# Patient Record
Sex: Female | Born: 1965 | Race: White | Hispanic: No | Marital: Married | Smoking: Never smoker
Health system: Southern US, Community
[De-identification: ages and names within clinical notes are randomized; demographics above are authoritative.]

## PROBLEM LIST (undated history)

## (undated) DIAGNOSIS — Z8489 Family history of other specified conditions: Secondary | ICD-10-CM

## (undated) DIAGNOSIS — G4730 Sleep apnea, unspecified: Secondary | ICD-10-CM

## (undated) DIAGNOSIS — R7303 Prediabetes: Secondary | ICD-10-CM

## (undated) DIAGNOSIS — R519 Headache, unspecified: Secondary | ICD-10-CM

## (undated) DIAGNOSIS — F988 Other specified behavioral and emotional disorders with onset usually occurring in childhood and adolescence: Secondary | ICD-10-CM

## (undated) DIAGNOSIS — T8859XA Other complications of anesthesia, initial encounter: Secondary | ICD-10-CM

## (undated) DIAGNOSIS — F32A Depression, unspecified: Secondary | ICD-10-CM

## (undated) DIAGNOSIS — D649 Anemia, unspecified: Secondary | ICD-10-CM

## (undated) DIAGNOSIS — T4145XA Adverse effect of unspecified anesthetic, initial encounter: Secondary | ICD-10-CM

## (undated) DIAGNOSIS — F419 Anxiety disorder, unspecified: Secondary | ICD-10-CM

## (undated) DIAGNOSIS — R51 Headache: Secondary | ICD-10-CM

## (undated) HISTORY — PX: CHOLECYSTECTOMY: SHX55

## (undated) HISTORY — PX: OTHER SURGICAL HISTORY: SHX169

## (undated) HISTORY — PX: ABDOMINAL HYSTERECTOMY: SHX81

---

## 2011-06-02 ENCOUNTER — Ambulatory Visit: Payer: Self-pay | Admitting: Internal Medicine

## 2012-07-14 ENCOUNTER — Ambulatory Visit: Payer: Self-pay | Admitting: Internal Medicine

## 2012-12-09 ENCOUNTER — Institutional Professional Consult (permissible substitution): Payer: Self-pay | Admitting: Cardiology

## 2014-06-19 ENCOUNTER — Ambulatory Visit
Admit: 2014-06-19 | Disposition: A | Discharge status: Home / Self Care | Payer: Self-pay | Attending: Primary Care | Admitting: Primary Care

## 2017-06-15 ENCOUNTER — Other Ambulatory Visit: Payer: Self-pay | Admitting: Internal Medicine

## 2017-06-15 DIAGNOSIS — Z1231 Encounter for screening mammogram for malignant neoplasm of breast: Secondary | ICD-10-CM

## 2017-07-16 ENCOUNTER — Ambulatory Visit
Admission: RE | Admit: 2017-07-16 | Discharge: 2017-07-16 | Disposition: A | Discharge status: Home / Self Care | Payer: BLUE CROSS/BLUE SHIELD | Source: Ambulatory Visit | Attending: Internal Medicine | Admitting: Internal Medicine

## 2017-07-16 DIAGNOSIS — Z1231 Encounter for screening mammogram for malignant neoplasm of breast: Secondary | ICD-10-CM | POA: Diagnosis not present

## 2017-08-06 ENCOUNTER — Other Ambulatory Visit: Payer: Self-pay

## 2017-08-06 ENCOUNTER — Encounter: Payer: Self-pay | Admitting: Gastroenterology

## 2017-08-06 ENCOUNTER — Ambulatory Visit (INDEPENDENT_AMBULATORY_CARE_PROVIDER_SITE_OTHER): Payer: BLUE CROSS/BLUE SHIELD | Admitting: Gastroenterology

## 2017-08-06 VITALS — BP 117/80 | HR 92 | Ht 66.0 in | Wt 242.0 lb

## 2017-08-06 DIAGNOSIS — K625 Hemorrhage of anus and rectum: Secondary | ICD-10-CM

## 2017-08-06 DIAGNOSIS — K5909 Other constipation: Secondary | ICD-10-CM

## 2017-08-06 DIAGNOSIS — Z1211 Encounter for screening for malignant neoplasm of colon: Secondary | ICD-10-CM

## 2017-08-06 NOTE — Progress Notes (Signed)
Cephas Darby, MD [place]  [place], [place] [address]  Main: [phone]  Fax: [phone]    Gastroenterology Consultation  Referring Provider:     Ellamae Sia, MD Primary Care Physician:  Ellamae Sia, MD Primary Gastroenterologist:  Dr. Cephas Darby Reason for Consultation:     Rectal bleeding        HPI:   Becky Lutz is a 52 y.o. female referred by Dr. Quay Burow, Ala Dach, MD  for consultation & management of rectal bleeding. Started about 2weeks ago, but now experiencing perianal itching, pressure, burning. She denies abdominal pain, bloating. Does have constipation. She spends several hours sitting at desk daily. She denies weight loss, n/v, melena. She has not tried any OTC medication  NSAIDs: none  Antiplts/Anticoagulants/Anti thrombotics: none  GI Procedures: colonoscopy 76yrs ago for rectal bleeding, normal  History reviewed. No pertinent past medical history.  Denies fam h/o GI malignancy  History reviewed. No pertinent surgical history.  Prior to Admission medications   Medication Sig Start Date End Date Taking? Authorizing Provider  dextroamphetamine (DEXTROSTAT) 5 MG tablet Take 5 mg by mouth 2 (two) times daily. 06/11/17   [provider]  DULoxetine (CYMBALTA) 60 MG capsule  03/05/17   [provider]  ketorolac (TORADOL) 10 MG tablet TAKE 1 TAB BY MOUTH AS NEEDED FOR MIGRAINE **MAX 4/DAY FOR 5 DAYS** 04/08/17   [provider]    Family History  Problem Relation Age of Onset  . Ovarian cancer Mother 26       not sure ovarian or uterine      Social History   Tobacco Use  . Smoking status: Never Smoker  . Smokeless tobacco: Never Used  Substance Use Topics  . Alcohol use: Not Currently  . Drug use: Not Currently    Allergies as of 08/06/2017  . (No Known Allergies)    Review of Systems:    All systems reviewed and negative except where noted in HPI.   Physical Exam:  BP  117/80   Pulse 92   Ht 5\' 6"  (1.676 m)   Wt 242 lb (109.8 kg)   BMI 39.06 kg/m  No LMP recorded. Patient has had a hysterectomy.  General:   Alert,  Well-developed, well-nourished, pleasant and cooperative in NAD Head:  Normocephalic and atraumatic. Eyes:  Sclera clear, no icterus.   Conjunctiva pink. Ears:  Normal auditory acuity. Nose:  No deformity, discharge, or lesions. Mouth:  No deformity or lesions,oropharynx pink & moist. Neck:  Supple; no masses or thyromegaly. Lungs:  Respirations even and unlabored.  Clear throughout to auscultation.   No wheezes, crackles, or rhonchi. No acute distress. Heart:  Regular rate and rhythm; no murmurs, clicks, rubs, or gallops. Abdomen:  Normal bowel sounds. Soft, non-tender and non-distended without masses, hepatosplenomegaly or hernias noted.  No guarding or rebound tenderness.   Rectal: Not performed Msk:  Symmetrical without gross deformities. Good, equal movement & strength bilaterally. Pulses:  Normal pulses noted. Extremities:  No clubbing or edema.  No cyanosis. Neurologic:  Alert and oriented x3;  grossly normal neurologically. Skin:  Intact without significant lesions or rashes. No jaundice. Lymph Nodes:  No significant cervical adenopathy. Psych:  Alert and cooperative. Normal mood and affect.  Imaging Studies: No abdominal iamging  Assessment and Plan:   Becky Lutz is a 52 y.o. white female with obesity, chronic constipation, rectal bleeding  - High fiber det - Fiber supplements -  Miralax daily - Colonoscopy for further evaluation - Discussed about hemorrhoid ligation after colonoscopy   Follow up in 4weeks   Cephas Darby, MD

## 2017-08-06 NOTE — Patient Instructions (Signed)
High-Fiber Diet  Fiber, also called dietary fiber, is a type of carbohydrate found in fruits, vegetables, whole grains, and beans. A high-fiber diet can have many health benefits. Your health care provider may recommend a high-fiber diet to help:  · Prevent constipation. Fiber can make your bowel movements more regular.  · Lower your cholesterol.  · Relieve hemorrhoids, uncomplicated diverticulosis, or irritable bowel syndrome.  · Prevent overeating as part of a weight-loss plan.  · Prevent heart disease, type 2 diabetes, and certain cancers.    What is my plan?  The recommended daily intake of fiber includes:  · 38 grams for men under age 50.  · 30 grams for men over age 50.  · 25 grams for women under age 50.  · 21 grams for women over age 50.    You can get the recommended daily intake of dietary fiber by eating a variety of fruits, vegetables, grains, and beans. Your health care provider may also recommend a fiber supplement if it is not possible to get enough fiber through your diet.  What do I need to know about a high-fiber diet?  · Fiber supplements have not been widely studied for their effectiveness, so it is better to get fiber through food sources.  · Always check the fiber content on the nutrition facts label of any prepackaged food. Look for foods that contain at least 5 grams of fiber per serving.  · Ask your dietitian if you have questions about specific foods that are related to your condition, especially if those foods are not listed in the following section.  · Increase your daily fiber consumption gradually. Increasing your intake of dietary fiber too quickly may cause bloating, cramping, or gas.  · Drink plenty of water. Water helps you to digest fiber.  What foods can I eat?  Grains  Whole-grain breads. Multigrain cereal. Oats and oatmeal. Brown rice. Barley. Bulgur wheat. Millet. Bran muffins. Popcorn. Rye wafer crackers.  Vegetables   Sweet potatoes. Spinach. Kale. Artichokes. Cabbage. Broccoli. Green peas. Carrots. Squash.  Fruits  Berries. Pears. Apples. Oranges. Avocados. Prunes and raisins. Dried figs.  Meats and Other Protein Sources  Navy, kidney, pinto, and soy beans. Split peas. Lentils. Nuts and seeds.  Dairy  Fiber-fortified yogurt.  Beverages  Fiber-fortified soy milk. Fiber-fortified orange juice.  Other  Fiber bars.  The items listed above may not be a complete list of recommended foods or beverages. Contact your dietitian for more options.  What foods are not recommended?  Grains  White bread. Pasta made with refined flour. White rice.  Vegetables  Fried potatoes. Canned vegetables. Well-cooked vegetables.  Fruits  Fruit juice. Cooked, strained fruit.  Meats and Other Protein Sources  Fatty cuts of meat. Fried poultry or fried fish.  Dairy  Milk. Yogurt. Cream cheese. Sour cream.  Beverages  Soft drinks.  Other  Cakes and pastries. Butter and oils.  The items listed above may not be a complete list of foods and beverages to avoid. Contact your dietitian for more information.  What are some tips for including high-fiber foods in my diet?  · Eat a wide variety of high-fiber foods.  · Make sure that half of all grains consumed each day are whole grains.  · Replace breads and cereals made from refined flour or white flour with whole-grain breads and cereals.  · Replace white rice with brown rice, bulgur wheat, or millet.  · Start the day with a breakfast that is high in fiber,   such as a cereal that contains at least 5 grams of fiber per serving.  · Use beans in place of meat in soups, salads, or pasta.  · Eat high-fiber snacks, such as berries, raw vegetables, nuts, or popcorn.  This information is not intended to replace advice given to you by your health care provider. Make sure you discuss any questions you have with your health care provider.  Document Released: 02/16/2005 Document Revised: 07/25/2015 Document Reviewed: 08/01/2013   Elsevier Interactive Patient Education © 2018 [employer]

## 2017-08-13 ENCOUNTER — Encounter: Payer: Self-pay | Admitting: Emergency Medicine

## 2017-08-16 ENCOUNTER — Encounter: Payer: Self-pay | Admitting: *Deleted

## 2017-08-16 ENCOUNTER — Ambulatory Visit: Payer: BLUE CROSS/BLUE SHIELD | Admitting: Certified Registered Nurse Anesthetist

## 2017-08-16 ENCOUNTER — Encounter
Admission: RE | Disposition: A | Discharge status: Home / Self Care | Payer: Self-pay | Source: Ambulatory Visit | Attending: Gastroenterology

## 2017-08-16 ENCOUNTER — Ambulatory Visit
Admission: RE | Admit: 2017-08-16 | Discharge: 2017-08-16 | Disposition: A | Discharge status: Home / Self Care | Payer: BLUE CROSS/BLUE SHIELD | Source: Ambulatory Visit | Attending: Gastroenterology | Admitting: Gastroenterology

## 2017-08-16 DIAGNOSIS — K5730 Diverticulosis of large intestine without perforation or abscess without bleeding: Secondary | ICD-10-CM | POA: Insufficient documentation

## 2017-08-16 DIAGNOSIS — K625 Hemorrhage of anus and rectum: Secondary | ICD-10-CM | POA: Diagnosis not present

## 2017-08-16 DIAGNOSIS — Z6839 Body mass index (BMI) 39.0-39.9, adult: Secondary | ICD-10-CM | POA: Insufficient documentation

## 2017-08-16 DIAGNOSIS — Z79899 Other long term (current) drug therapy: Secondary | ICD-10-CM | POA: Diagnosis not present

## 2017-08-16 DIAGNOSIS — R7303 Prediabetes: Secondary | ICD-10-CM | POA: Diagnosis not present

## 2017-08-16 DIAGNOSIS — K5909 Other constipation: Secondary | ICD-10-CM

## 2017-08-16 DIAGNOSIS — K644 Residual hemorrhoidal skin tags: Secondary | ICD-10-CM | POA: Insufficient documentation

## 2017-08-16 HISTORY — DX: Sleep apnea, unspecified: G47.30

## 2017-08-16 HISTORY — PX: COLONOSCOPY WITH PROPOFOL: SHX5780

## 2017-08-16 HISTORY — DX: Prediabetes: R73.03

## 2017-08-16 LAB — GLUCOSE, CAPILLARY: Glucose-Capillary: 100 mg/dL — ABNORMAL HIGH (ref 65–99)

## 2017-08-16 SURGERY — COLONOSCOPY WITH PROPOFOL
Anesthesia: General

## 2017-08-16 MED ORDER — PROPOFOL 10 MG/ML IV BOLUS
INTRAVENOUS | Status: DC | PRN
  Administered 2017-08-16: 60 mg via INTRAVENOUS
  Administered 2017-08-16: 30 mg via INTRAVENOUS

## 2017-08-16 MED ORDER — PROPOFOL 500 MG/50ML IV EMUL
INTRAVENOUS | Status: DC | PRN
  Administered 2017-08-16: 175 ug/kg/min via INTRAVENOUS

## 2017-08-16 MED ORDER — PROPOFOL 500 MG/50ML IV EMUL
INTRAVENOUS | Status: AC
  Filled 2017-08-16: qty 50

## 2017-08-16 MED ORDER — LIDOCAINE HCL (PF) 2 % IJ SOLN
INTRAMUSCULAR | Status: AC
  Filled 2017-08-16: qty 10

## 2017-08-16 MED ORDER — LIDOCAINE HCL (CARDIAC) PF 100 MG/5ML IV SOSY
PREFILLED_SYRINGE | INTRAVENOUS | Status: DC | PRN
  Administered 2017-08-16: 50 mg via INTRAVENOUS

## 2017-08-16 MED ORDER — SODIUM CHLORIDE 0.9 % IV SOLN
INTRAVENOUS | Status: DC
  Administered 2017-08-16: 11:00:00 via INTRAVENOUS

## 2017-08-16 NOTE — Anesthesia Preprocedure Evaluation (Signed)
Anesthesia Evaluation  Patient identified by MRN, date of birth, ID band Patient awake    Reviewed: Allergy & Precautions, H&P , NPO status , Patient's Chart, lab work & pertinent test results, reviewed documented beta blocker date and time   History of Anesthesia Complications Negative for: history of anesthetic complications  Airway Mallampati: III  TM Distance: >3 FB Neck ROM: full    Dental  (+) Dental Advidsory Given, Teeth Intact   Pulmonary neg shortness of breath, sleep apnea , neg COPD, neg recent URI,           Cardiovascular Exercise Tolerance: Good negative cardio ROS       Neuro/Psych negative neurological ROS  negative psych ROS   GI/Hepatic Neg liver ROS, GERD  ,  Endo/Other  diabetes (Borderline)Morbid obesity  Renal/GU negative Renal ROS  negative genitourinary   Musculoskeletal   Abdominal   Peds  Hematology negative hematology ROS (+)   Anesthesia Other Findings Past Medical History: No date: Pre-diabetes No date: Sleep apnea   Reproductive/Obstetrics negative OB ROS                             Anesthesia Physical Anesthesia Plan  ASA: III  Anesthesia Plan: General   Post-op Pain Management:    Induction: Intravenous  PONV Risk Score and Plan: 3 and Propofol infusion  Airway Management Planned: Nasal Cannula  Additional Equipment:   Intra-op Plan:   Post-operative Plan:   Informed Consent: I have reviewed the patients History and Physical, chart, labs and discussed the procedure including the risks, benefits and alternatives for the proposed anesthesia with the patient or authorized representative who has indicated his/her understanding and acceptance.   Dental Advisory Given  Plan Discussed with: Anesthesiologist, CRNA and Surgeon  Anesthesia Plan Comments:         Anesthesia Quick Evaluation

## 2017-08-16 NOTE — Anesthesia Post-op Follow-up Note (Signed)
Anesthesia QCDR form completed.        

## 2017-08-16 NOTE — H&P (Signed)
Cephas Darby, MD [place]  [place], [place] [address]  Main: [phone]  Fax: [phone] Pager: [phone]  Primary Care Physician:  Ellamae Sia, MD Primary Gastroenterologist:  Dr. Cephas Darby  Pre-Procedure History & Physical: HPI:  Becky Lutz is a 52 y.o. female is here for an colonoscopy.   Past Medical History:  Diagnosis Date  . Pre-diabetes   . Sleep apnea     Past Surgical History:  Procedure Laterality Date  . ABDOMINAL HYSTERECTOMY     2006  . arm surgery move nerve  Right   . CESAREAN SECTION    . CHOLECYSTECTOMY     1996    Prior to Admission medications   Medication Sig Start Date End Date Taking? Authorizing Provider  dextroamphetamine (DEXTROSTAT) 5 MG tablet Take 5 mg by mouth 2 (two) times daily. 06/11/17   [provider]  DULoxetine (CYMBALTA) 60 MG capsule  03/05/17   [provider]  ketorolac (TORADOL) 10 MG tablet TAKE 1 TAB BY MOUTH AS NEEDED FOR MIGRAINE **MAX 4/DAY FOR 5 DAYS** 04/08/17   [provider]    Allergies as of 08/06/2017  . (No Known Allergies)    Family History  Problem Relation Age of Onset  . Ovarian cancer Mother 52       not sure ovarian or uterine     Social History   Socioeconomic History  . Marital status: Married    Spouse name: Not on file  . Number of children: Not on file  . Years of education: Not on file  . Highest education level: Not on file  Occupational History  . Not on file  Social Needs  . Financial resource strain: Not on file  . Food insecurity:    Worry: Not on file    Inability: Not on file  . Transportation needs:    Medical: Not on file    Non-medical: Not on file  Tobacco Use  . Smoking status: Never Smoker  . Smokeless tobacco: Never Used  Substance and Sexual Activity  . Alcohol use: Not Currently  . Drug use: Not Currently  . Sexual activity: Not on file  Lifestyle  . Physical activity:    Days per week:  Not on file    Minutes per session: Not on file  . Stress: Not on file  Relationships  . Social connections:    Talks on phone: Not on file    Gets together: Not on file    Attends religious service: Not on file    Active member of club or organization: Not on file    Attends meetings of clubs or organizations: Not on file    Relationship status: Not on file  . Intimate partner violence:    Fear of current or ex partner: Not on file    Emotionally abused: Not on file    Physically abused: Not on file    Forced sexual activity: Not on file  Other Topics Concern  . Not on file  Social History Narrative  . Not on file    Review of Systems: See HPI, otherwise negative ROS  Physical Exam: BP (!) 130/93   Pulse 91   Temp 97.9 F (36.6 C) (Tympanic)   Resp 16   Ht 5\' 6"  (1.676 m)   Wt 242 lb (109.8 kg)   SpO2 99%   BMI 39.06 kg/m  General:   Alert,  pleasant and cooperative in NAD Head:  Normocephalic and atraumatic. Neck:  Supple; no masses or thyromegaly. Lungs:  Clear throughout to auscultation.    Heart:  Regular rate and rhythm. Abdomen:  Soft, nontender and nondistended. Normal bowel sounds, without guarding, and without rebound.   Neurologic:  Alert and  oriented x4;  grossly normal neurologically.  Impression/Plan: Becky Lutz is here for an colonoscopy to be performed for rectal bleeding  Risks, benefits, limitations, and alternatives regarding  colonoscopy have been reviewed with the patient.  Questions have been answered.  All parties agreeable.   Sherri Sear, MD  08/16/2017, 11:11 AM

## 2017-08-16 NOTE — Anesthesia Postprocedure Evaluation (Signed)
Anesthesia Post Note  Patient: Becky Lutz  Procedure(s) Performed: COLONOSCOPY WITH PROPOFOL (N/A )  Patient location during evaluation: Endoscopy Anesthesia Type: General Level of consciousness: awake and alert Pain management: pain level controlled Vital Signs Assessment: post-procedure vital signs reviewed and stable Respiratory status: spontaneous breathing, nonlabored ventilation, respiratory function stable and patient connected to nasal cannula oxygen Cardiovascular status: blood pressure returned to baseline and stable Postop Assessment: no apparent nausea or vomiting Anesthetic complications: no     Last Vitals:  Vitals:   08/16/17 1140 08/16/17 1217  BP: 96/69 107/77  Pulse:    Resp: 20 18  Temp: (!) 36.1 C   SpO2: 97% 99%    Last Pain:  Vitals:   08/16/17 1217  TempSrc:   PainSc: 0-No pain                 Martha Clan

## 2017-08-16 NOTE — Transfer of Care (Signed)
Immediate Anesthesia Transfer of Care Note  Patient: Becky Lutz  Procedure(s) Performed: COLONOSCOPY WITH PROPOFOL (N/A )  Patient Location: PACU  Anesthesia Type:General  Level of Consciousness: awake, alert  and oriented  Airway & Oxygen Therapy: Patient Spontanous Breathing and Patient connected to nasal cannula oxygen  Post-op Assessment: Report given to RN and Post -op Vital signs reviewed and stable  Post vital signs: Reviewed and stable  Last Vitals:  Vitals Value Taken Time  BP 96/69 08/16/2017 11:42 AM  Temp 36.1 C 08/16/2017 11:40 AM  Pulse 83 08/16/2017 11:43 AM  Resp 24 08/16/2017 11:43 AM  SpO2 96 % 08/16/2017 11:43 AM  Vitals shown include unvalidated device data.  Last Pain:  Vitals:   08/16/17 1140  TempSrc: Tympanic  PainSc: 0-No pain         Complications: No apparent anesthesia complications

## 2017-08-16 NOTE — Op Note (Signed)
[facility] Gastroenterology Patient Name: Becky Lutz Procedure Date: 08/16/2017 11:17 AM MRN: [id number] Account #: [id number] Date of Birth: 10-11-1965 Admit Type: Outpatient Age: 52 Room: [facility] ENDO ROOM 2 Gender: Female Note Status: Finalized Procedure:            Colonoscopy Indications:          Rectal bleeding Providers:            Lin Landsman MD, MD Referring MD:         Remus Blake MD, MD (Referring MD) Medicines:            Monitored Anesthesia Care Complications:        No immediate complications. Estimated blood loss: None. Procedure:            Pre-Anesthesia Assessment:                       - Prior to the procedure, a History and Physical was                        performed, and patient medications and allergies were                        reviewed. The patient is competent. The risks and                        benefits of the procedure and the sedation options and                        risks were discussed with the patient. All questions                        were answered and informed consent was obtained.                        Patient identification and proposed procedure were                        verified by the physician, the nurse, the                        anesthesiologist, the anesthetist and the technician in                        the pre-procedure area in the procedure room in the                        endoscopy suite. Mental Status Examination: alert and                        oriented. Airway Examination: normal oropharyngeal                        airway and neck mobility. Respiratory Examination:                        clear to auscultation. CV Examination: normal.                        Prophylactic Antibiotics: The patient does not require  prophylactic antibiotics. Prior Anticoagulants: The                        patient has taken no previous anticoagulant or   antiplatelet agents. ASA Grade Assessment: III - A                        patient with severe systemic disease. After reviewing                        the risks and benefits, the patient was deemed in                        satisfactory condition to undergo the procedure. The                        anesthesia plan was to use monitored anesthesia care                        (MAC). Immediately prior to administration of                        medications, the patient was re-assessed for adequacy                        to receive sedatives. The heart rate, respiratory rate,                        oxygen saturations, blood pressure, adequacy of                        pulmonary ventilation, and response to care were                        monitored throughout the procedure. The physical status                        of the patient was re-assessed after the procedure.                       After obtaining informed consent, the colonoscope was                        passed under direct vision. Throughout the procedure,                        the patient's blood pressure, pulse, and oxygen                        saturations were monitored continuously. The                        Colonoscope was introduced through the anus and                        advanced to the the terminal ileum. The colonoscopy was                        performed without difficulty. The patient tolerated the  procedure well. The quality of the bowel preparation                        was evaluated using the BBPS [facility] Bowel Preparation                        Scale) with scores of: Right Colon = 3, Transverse                        Colon = 3 and Left Colon = 3 (entire mucosa seen well                        with no residual staining, small fragments of stool or                        opaque liquid). The total BBPS score equals 9. Findings:      Skin tags were found on perianal exam.      The terminal  ileum appeared normal.      Many diverticula were found in the sigmoid colon. There was no evidence       of diverticular bleeding.      The retroflexed view of the distal rectum and anal verge was normal and       showed no anal or rectal abnormalities. Impression:           - Perianal skin tags found on perianal exam.                       - The examined portion of the ileum was normal.                       - Moderate diverticulosis in the sigmoid colon. There                        was no evidence of diverticular bleeding.                       - The distal rectum and anal verge are normal on                        retroflexion view.                       - No specimens collected. Recommendation:       - Discharge patient to home (with escort).                       - Resume previous diet today.                       - Continue present medications.                       - Repeat colonoscopy in 10 years for surveillance.                       - Return to my office as previously scheduled. Procedure Code(s):    --- Professional ---                       [phone], Colonoscopy,  flexible; diagnostic, including                        collection of specimen(s) by brushing or washing, when                        performed (separate procedure) Diagnosis Code(s):    --- Professional ---                       K64.4, Residual hemorrhoidal skin tags                       K62.5, Hemorrhage of anus and rectum                       K57.30, Diverticulosis of large intestine without                        perforation or abscess without bleeding CPT copyright 2017 American Medical Association. All rights reserved. The codes documented in this report are preliminary and upon coder review may  be revised to meet current compliance requirements. Dr. Ulyess Mort Lin Landsman MD, MD 08/16/2017 11:35:18 AM This report has been signed electronically. Number of Addenda: 0 Note Initiated On: 08/16/2017  11:17 AM Scope Withdrawal Time: 0 hours 7 minutes 56 seconds  Total Procedure Duration: 0 hours 10 minutes 26 seconds       [facility]

## 2017-08-16 NOTE — Anesthesia Procedure Notes (Signed)
Date/Time: 08/16/2017 11:18 AM Performed by: Johnna Acosta, CRNA Pre-anesthesia Checklist: Patient identified, Emergency Drugs available, Suction available, Patient being monitored and Timeout performed Patient Re-evaluated:Patient Re-evaluated prior to induction Oxygen Delivery Method: Nasal cannula Preoxygenation: Pre-oxygenation with 100% oxygen

## 2017-08-18 ENCOUNTER — Encounter: Payer: Self-pay | Admitting: Gastroenterology

## 2017-09-14 ENCOUNTER — Encounter: Payer: Self-pay | Admitting: Gastroenterology

## 2017-09-14 ENCOUNTER — Ambulatory Visit (INDEPENDENT_AMBULATORY_CARE_PROVIDER_SITE_OTHER): Payer: BLUE CROSS/BLUE SHIELD | Admitting: Gastroenterology

## 2017-09-14 VITALS — BP 110/76 | HR 90 | Resp 17 | Ht 66.0 in | Wt 240.4 lb

## 2017-09-14 DIAGNOSIS — K5904 Chronic idiopathic constipation: Secondary | ICD-10-CM | POA: Diagnosis not present

## 2017-09-14 DIAGNOSIS — K625 Hemorrhage of anus and rectum: Secondary | ICD-10-CM

## 2017-09-14 NOTE — Progress Notes (Signed)
Becky Darby, MD [address]  [place], [place] [address]  Main: [phone]  Fax: [phone]    Gastroenterology Consultation  Referring Provider:     Ellamae Sia, MD Primary Care Physician:  Ellamae Sia, MD Primary Gastroenterologist:  Dr. Cephas Lutz Reason for Consultation:     Rectal bleeding        HPI:   Becky Lutz is a 52 y.o. female referred by Dr. Quay Burow, Ala Dach, MD  for consultation & management of rectal bleeding. Started about 2weeks ago, but now experiencing perianal itching, pressure, burning. She denies abdominal pain, bloating. Does have constipation. She spends several hours sitting at desk daily. She denies weight loss, n/v, melena. She has not tried any OTC medication  Follow-up visit 09/14/17 Since last visit, patient underwent colonoscopy and it was unremarkable except for sigmoid diverticulosis. She has been traveling for the last few weeks and unable to follow high-fiber diet. She reports intermittent constipation and rectal bleeding.  NSAIDs: none  Antiplts/Anticoagulants/Anti thrombotics: none  GI Procedures: colonoscopy 92yrs ago for rectal bleeding, normal  Colonoscopy 08/16/2017 - Perianal skin tags found on perianal exam. - The examined portion of the ileum was normal. - Moderate diverticulosis in the sigmoid colon. There was no evidence of diverticular bleeding. - The distal rectum and anal verge are normal on retroflexion view. - No specimens collected.  Past Medical History:  Diagnosis Date  . Pre-diabetes   . Sleep apnea     Denies fam h/o GI malignancy  Past Surgical History:  Procedure Laterality Date  . ABDOMINAL HYSTERECTOMY     2006  . arm surgery move nerve  Right   . CESAREAN SECTION    . CHOLECYSTECTOMY     1996  . COLONOSCOPY WITH PROPOFOL N/A 08/16/2017   Procedure: COLONOSCOPY WITH PROPOFOL;  Surgeon: Lin Landsman, MD;  Location: [facility];  Service:  Gastroenterology;  Laterality: N/A;    Current Outpatient Medications:  .  dextroamphetamine (DEXTROSTAT) 5 MG tablet, Take 5 mg by mouth 2 (two) times daily., Disp: , Rfl: 0 .  DULoxetine (CYMBALTA) 60 MG capsule, , Disp: , Rfl:  .  ketorolac (TORADOL) 10 MG tablet, TAKE 1 TAB BY MOUTH AS NEEDED FOR MIGRAINE **MAX 4/DAY FOR 5 DAYS**, Disp: , Rfl:  .  naproxen sodium (ALEVE) 220 MG tablet, Take 220 mg by mouth 2 (two) times daily as needed., Disp: , Rfl:     Family History  Problem Relation Age of Onset  . Ovarian cancer Mother 52       not sure ovarian or uterine      Social History   Tobacco Use  . Smoking status: Never Smoker  . Smokeless tobacco: Never Used  Substance Use Topics  . Alcohol use: Not Currently  . Drug use: Not Currently    Allergies as of 09/14/2017 - Review Complete 09/14/2017  Allergen Reaction Noted  . Codeine Other (See Comments) 08/16/2017    Review of Systems:    All systems reviewed and negative except where noted in HPI.   Physical Exam:  BP 110/76 (BP Location: Left Arm, Patient Position: Sitting, Cuff Size: Large)   Pulse 90   Resp 17   Ht 5\' 6"  (1.676 m)   Wt 240 lb 6.4 oz (109 kg)   BMI 38.80 kg/m  No LMP recorded. Patient has had a hysterectomy.  General:   Alert,  Well-developed, well-nourished, pleasant and cooperative in NAD Head:  Normocephalic and atraumatic. Eyes:  Sclera clear, no icterus.   Conjunctiva pink. Ears:  Normal auditory acuity. Nose:  No deformity, discharge, or lesions. Mouth:  No deformity or lesions,oropharynx pink & moist. Neck:  Supple; no masses or thyromegaly. Lungs:  Respirations even and unlabored.  Clear throughout to auscultation.   No wheezes, crackles, or rhonchi. No acute distress. Heart:  Regular rate and rhythm; no murmurs, clicks, rubs, or gallops. Abdomen:  Normal bowel sounds. Soft, non-tender and non-distended without masses, hepatosplenomegaly or hernias noted.  No guarding or rebound  tenderness.   Rectal: Not performed Msk:  Symmetrical without gross deformities. Good, equal movement & strength bilaterally. Pulses:  Normal pulses noted. Extremities:  No clubbing or edema.  No cyanosis. Neurologic:  Alert and oriented x3;  grossly normal neurologically. Skin:  Intact without significant lesions or rashes. No jaundice. Lymph Nodes:  No significant cervical adenopathy. Psych:  Alert and cooperative. Normal mood and affect.  Imaging Studies: No abdominal imaging  Assessment and Plan:   Becky Lutz is a 52 y.o. white female with obesity, chronic constipation, rectal bleeding Colonoscopy showed SIGMOID diverticulosis only  Chronic constipation: -  Continue High fiber det -  Continue Fiber supplements -  Continue Miralax daily -  Discussed about hemorrhoid ligation and patient is not interested in it at this time  Colon cancer screening: Repeat colonoscopy in 10 years  Follow up as needed   Becky Darby, MD

## 2017-10-07 ENCOUNTER — Other Ambulatory Visit: Payer: Self-pay | Admitting: Podiatry

## 2017-10-14 ENCOUNTER — Other Ambulatory Visit: Payer: Self-pay

## 2017-10-14 ENCOUNTER — Encounter
Admission: RE | Admit: 2017-10-14 | Discharge: 2017-10-14 | Disposition: A | Discharge status: Home / Self Care | Payer: BLUE CROSS/BLUE SHIELD | Source: Ambulatory Visit | Attending: Podiatry | Admitting: Podiatry

## 2017-10-14 HISTORY — DX: Headache: R51

## 2017-10-14 HISTORY — DX: Other complications of anesthesia, initial encounter: T88.59XA

## 2017-10-14 HISTORY — DX: Anemia, unspecified: D64.9

## 2017-10-14 HISTORY — DX: Family history of other specified conditions: Z84.89

## 2017-10-14 HISTORY — DX: Anxiety disorder, unspecified: F41.9

## 2017-10-14 HISTORY — DX: Headache, unspecified: R51.9

## 2017-10-14 HISTORY — DX: Adverse effect of unspecified anesthetic, initial encounter: T41.45XA

## 2017-10-14 NOTE — Patient Instructions (Addendum)
Your procedure is scheduled on: 10-22-17  Report to Same Day Surgery 2nd floor medical mall [facility] Entrance-take elevator on left to 2nd floor.  Check in with surgery information desk.) To find out your arrival time please call [phone] between 1PM - 3PM on 10-21-17   Remember: Instructions that are not followed completely may result in serious medical risk, up to and including death, or upon the discretion of your surgeon and anesthesiologist your surgery may need to be rescheduled.    _x___ 1. Do not eat food after midnight the night before your procedure. You may drink clear liquids up to 2 hours before you are scheduled to arrive at the hospital for your procedure.  Do not drink clear liquids within 2 hours of your scheduled arrival to the hospital.  Clear liquids include  --Water or Apple juice without pulp  --Clear carbohydrate beverage such as ClearFast or Gatorade  --Black Coffee or Clear Tea (No milk, no creamers, do not add anything to the coffee or Tea   ____Ensure clear carbohydrate drink on the way to the hospital for bariatric patients  ____Ensure clear carbohydrate drink 3 hours before surgery for Dr Dwyane Luo patients if physician instructed.   No gum chewing or hard candies.     __x__ 2. No Alcohol for 24 hours before or after surgery.   __x__3. No Smoking or e-cigarettes for 24 prior to surgery.  Do not use any chewable tobacco products for at least 6 hour prior to surgery   ____  4. Bring all medications with you on the day of surgery if instructed.    __x__ 5. Notify your doctor if there is any change in your medical condition     (cold, fever, infections).    x___6. On the morning of surgery brush your teeth with toothpaste and water.  You may rinse your mouth with mouth wash if you wish.  Do not swallow any toothpaste or mouthwash.   Do not wear jewelry, make-up, hairpins, clips or nail polish.  Do not wear lotions, powders, or perfumes. You may wear  deodorant.  Do not shave 48 hours prior to surgery. Men may shave face and neck.  Do not bring valuables to the hospital.    [facility] is not responsible for any belongings or valuables.               Contacts, dentures or bridgework may not be worn into surgery.  Leave your suitcase in the car. After surgery it may be brought to your room.  For patients admitted to the hospital, discharge time is determined by your treatment team.  _  Patients discharged the day of surgery will not be allowed to drive home.  You will need someone to drive you home and stay with you the night of your procedure.    Please read over the following fact sheets that you were given:   [facility] Preparing for Surgery  _x___ TAKE THE FOLLOWING MEDICATION THE MORNING OF SURGERY WITH A SMALL SIP OF WATER. These include:  1. CYMBALTA  2.  3.  4.  5.  6.  ____Fleets enema or Magnesium Citrate as directed.   ____ Use CHG Soap or sage wipes as directed on instruction sheet   ____ Use inhalers on the day of surgery and bring to hospital day of surgery  ____ Stop Metformin and Janumet 2 days prior to surgery.    ____ Take 1/2 of usual insulin dose the night before  surgery and none on the morning surgery.   ____ Follow recommendations from Cardiologist, Pulmonologist or PCP regarding stopping Aspirin, Coumadin, Plavix ,Eliquis, Effient, or Pradaxa, and Pletal.  X____Stop Anti-inflammatories such as Advil, Aleve, Ibuprofen, Motrin, Naproxen, Naprosyn, Goodies powders, EXCEDRIN MIGRAINE or aspirin products NOW-OK to take Tylenol    ____ Stop supplements until after surgery.   ____ Bring C-Pap to the hospital.

## 2017-10-21 MED ORDER — CEFAZOLIN SODIUM-DEXTROSE 2-4 GM/100ML-% IV SOLN
2.0000 g | INTRAVENOUS | Status: AC
  Administered 2017-10-22: 2 g via INTRAVENOUS

## 2017-10-22 ENCOUNTER — Ambulatory Visit: Payer: BLUE CROSS/BLUE SHIELD | Admitting: Anesthesiology

## 2017-10-22 ENCOUNTER — Other Ambulatory Visit: Payer: Self-pay

## 2017-10-22 ENCOUNTER — Encounter
Admission: RE | Disposition: A | Discharge status: Home / Self Care | Payer: Self-pay | Source: Ambulatory Visit | Attending: Podiatry

## 2017-10-22 ENCOUNTER — Ambulatory Visit
Admission: RE | Admit: 2017-10-22 | Discharge: 2017-10-22 | Disposition: A | Discharge status: Home / Self Care | Payer: BLUE CROSS/BLUE SHIELD | Source: Ambulatory Visit | Attending: Podiatry | Admitting: Podiatry

## 2017-10-22 ENCOUNTER — Encounter: Payer: Self-pay | Admitting: Emergency Medicine

## 2017-10-22 DIAGNOSIS — Z79899 Other long term (current) drug therapy: Secondary | ICD-10-CM | POA: Diagnosis not present

## 2017-10-22 DIAGNOSIS — F329 Major depressive disorder, single episode, unspecified: Secondary | ICD-10-CM | POA: Insufficient documentation

## 2017-10-22 DIAGNOSIS — M2012 Hallux valgus (acquired), left foot: Secondary | ICD-10-CM | POA: Diagnosis not present

## 2017-10-22 DIAGNOSIS — G4730 Sleep apnea, unspecified: Secondary | ICD-10-CM | POA: Diagnosis not present

## 2017-10-22 DIAGNOSIS — M7732 Calcaneal spur, left foot: Secondary | ICD-10-CM | POA: Diagnosis not present

## 2017-10-22 DIAGNOSIS — M7662 Achilles tendinitis, left leg: Secondary | ICD-10-CM | POA: Insufficient documentation

## 2017-10-22 HISTORY — PX: OSTECTOMY: SHX6439

## 2017-10-22 HISTORY — PX: HALLUX VALGUS LAPIDUS: SHX6626

## 2017-10-22 HISTORY — PX: ACHILLES TENDON SURGERY: SHX542

## 2017-10-22 LAB — GLUCOSE, CAPILLARY
Glucose-Capillary: 96 mg/dL (ref 70–99)
Glucose-Capillary: 154 mg/dL — ABNORMAL HIGH (ref 70–99)

## 2017-10-22 SURGERY — BUNIONECTOMY, LAPIDUS
Anesthesia: General | Laterality: Left | Wound class: Clean

## 2017-10-22 MED ORDER — LACTATED RINGERS IV SOLN
INTRAVENOUS | Status: DC
  Administered 2017-10-22 (×2): via INTRAVENOUS

## 2017-10-22 MED ORDER — FENTANYL CITRATE (PF) 100 MCG/2ML IJ SOLN
25.0000 ug | INTRAMUSCULAR | Status: DC | PRN
  Administered 2017-10-22: 100 ug via INTRAVENOUS
  Administered 2017-10-22: 25 ug via INTRAVENOUS

## 2017-10-22 MED ORDER — BUPIVACAINE HCL (PF) 0.5 % IJ SOLN
INTRAMUSCULAR | Status: AC
  Filled 2017-10-22: qty 30

## 2017-10-22 MED ORDER — ONDANSETRON HCL 4 MG/2ML IJ SOLN: 4.0000 mg | Freq: Four times a day (QID) | INTRAMUSCULAR | Status: DC | PRN

## 2017-10-22 MED ORDER — METOCLOPRAMIDE HCL 5 MG/ML IJ SOLN
INTRAMUSCULAR | Status: AC
  Administered 2017-10-22: 10 mg
  Filled 2017-10-22: qty 2

## 2017-10-22 MED ORDER — PHENYLEPHRINE HCL 10 MG/ML IJ SOLN
INTRAMUSCULAR | Status: DC | PRN
  Administered 2017-10-22: 100 ug via INTRAVENOUS

## 2017-10-22 MED ORDER — LIDOCAINE HCL (PF) 1 % IJ SOLN
INTRAMUSCULAR | Status: AC
  Filled 2017-10-22: qty 30

## 2017-10-22 MED ORDER — PROPOFOL 10 MG/ML IV BOLUS
INTRAVENOUS | Status: AC
  Filled 2017-10-22: qty 20

## 2017-10-22 MED ORDER — ONDANSETRON HCL 4 MG/2ML IJ SOLN
4.0000 mg | Freq: Once | INTRAMUSCULAR | Status: AC | PRN
  Administered 2017-10-22: 4 mg via INTRAVENOUS

## 2017-10-22 MED ORDER — MIDAZOLAM HCL 2 MG/2ML IJ SOLN
1.0000 mg | Freq: Once | INTRAMUSCULAR | Status: AC
  Administered 2017-10-22: 1 mg via INTRAVENOUS

## 2017-10-22 MED ORDER — EPINEPHRINE PF 1 MG/ML IJ SOLN
INTRAMUSCULAR | Status: AC
  Filled 2017-10-22: qty 1

## 2017-10-22 MED ORDER — FENTANYL CITRATE (PF) 100 MCG/2ML IJ SOLN
50.0000 ug | Freq: Once | INTRAMUSCULAR | Status: AC
  Administered 2017-10-22: 50 ug via INTRAVENOUS

## 2017-10-22 MED ORDER — FAMOTIDINE 20 MG PO TABS
20.0000 mg | ORAL_TABLET | Freq: Once | ORAL | Status: AC
  Administered 2017-10-22: 20 mg via ORAL

## 2017-10-22 MED ORDER — DEXAMETHASONE SODIUM PHOSPHATE 10 MG/ML IJ SOLN
INTRAMUSCULAR | Status: AC
  Administered 2017-10-22: 10 mg
  Filled 2017-10-22: qty 1

## 2017-10-22 MED ORDER — SUGAMMADEX SODIUM 200 MG/2ML IV SOLN
INTRAVENOUS | Status: DC | PRN
  Administered 2017-10-22: 216.8 mg via INTRAVENOUS

## 2017-10-22 MED ORDER — BUPIVACAINE HCL (PF) 0.25 % IJ SOLN
INTRAMUSCULAR | Status: DC | PRN
  Administered 2017-10-22: 20 mL

## 2017-10-22 MED ORDER — MIDAZOLAM HCL 2 MG/2ML IJ SOLN
INTRAMUSCULAR | Status: DC | PRN
  Administered 2017-10-22: 2 mg via INTRAVENOUS

## 2017-10-22 MED ORDER — FENTANYL CITRATE (PF) 100 MCG/2ML IJ SOLN
INTRAMUSCULAR | Status: AC
  Filled 2017-10-22: qty 2

## 2017-10-22 MED ORDER — ONDANSETRON HCL 4 MG/2ML IJ SOLN
INTRAMUSCULAR | Status: AC
  Filled 2017-10-22: qty 2

## 2017-10-22 MED ORDER — POVIDONE-IODINE 7.5 % EX SOLN: Freq: Once | CUTANEOUS | Status: DC

## 2017-10-22 MED ORDER — ONDANSETRON HCL 4 MG/2ML IJ SOLN
INTRAMUSCULAR | Status: DC | PRN
  Administered 2017-10-22 (×2): 4 mg via INTRAVENOUS

## 2017-10-22 MED ORDER — MIDAZOLAM HCL 2 MG/2ML IJ SOLN
INTRAMUSCULAR | Status: AC
  Administered 2017-10-22: 1 mg via INTRAVENOUS
  Filled 2017-10-22: qty 2

## 2017-10-22 MED ORDER — FAMOTIDINE 20 MG PO TABS
ORAL_TABLET | ORAL | Status: AC
  Administered 2017-10-22: 20 mg via ORAL
  Filled 2017-10-22: qty 1

## 2017-10-22 MED ORDER — BUPIVACAINE HCL (PF) 0.25 % IJ SOLN
INTRAMUSCULAR | Status: AC
  Filled 2017-10-22: qty 30

## 2017-10-22 MED ORDER — LIDOCAINE-EPINEPHRINE (PF) 1 %-1:200000 IJ SOLN
INTRAMUSCULAR | Status: AC
  Filled 2017-10-22: qty 30

## 2017-10-22 MED ORDER — TAPENTADOL HCL 50 MG PO TABS: 50.0000 mg | ORAL_TABLET | ORAL | 0 refills | Status: DC | PRN

## 2017-10-22 MED ORDER — FENTANYL CITRATE (PF) 100 MCG/2ML IJ SOLN
INTRAMUSCULAR | Status: AC
  Administered 2017-10-22: 50 ug via INTRAVENOUS
  Filled 2017-10-22: qty 2

## 2017-10-22 MED ORDER — PROPOFOL 10 MG/ML IV BOLUS
INTRAVENOUS | Status: DC | PRN
  Administered 2017-10-22: 150 mg via INTRAVENOUS

## 2017-10-22 MED ORDER — LIDOCAINE-EPINEPHRINE (PF) 1 %-1:200000 IJ SOLN
INTRAMUSCULAR | Status: DC | PRN
  Administered 2017-10-22: 10 mL

## 2017-10-22 MED ORDER — LIDOCAINE HCL (CARDIAC) PF 100 MG/5ML IV SOSY
PREFILLED_SYRINGE | INTRAVENOUS | Status: DC | PRN
  Administered 2017-10-22: 100 mg via INTRAVENOUS

## 2017-10-22 MED ORDER — ONDANSETRON HCL 4 MG PO TABS: 4.0000 mg | ORAL_TABLET | Freq: Four times a day (QID) | ORAL | Status: DC | PRN

## 2017-10-22 MED ORDER — ROPIVACAINE HCL 5 MG/ML IJ SOLN
INTRAMUSCULAR | Status: AC
  Filled 2017-10-22: qty 30

## 2017-10-22 MED ORDER — SODIUM CHLORIDE 0.9 % IV SOLN
INTRAVENOUS | Status: DC | PRN
  Administered 2017-10-22: 25 ug/min via INTRAVENOUS

## 2017-10-22 MED ORDER — MIDAZOLAM HCL 2 MG/2ML IJ SOLN
INTRAMUSCULAR | Status: AC
  Filled 2017-10-22: qty 2

## 2017-10-22 MED ORDER — FENTANYL CITRATE (PF) 100 MCG/2ML IJ SOLN
INTRAMUSCULAR | Status: AC
  Administered 2017-10-22: 100 ug via INTRAVENOUS
  Filled 2017-10-22: qty 2

## 2017-10-22 MED ORDER — DEXAMETHASONE SODIUM PHOSPHATE 10 MG/ML IJ SOLN
INTRAMUSCULAR | Status: DC | PRN
  Administered 2017-10-22: 10 mg via INTRAVENOUS

## 2017-10-22 MED ORDER — TRAMADOL HCL 50 MG PO TABS: 50.0000 mg | ORAL_TABLET | Freq: Four times a day (QID) | ORAL | 0 refills | Status: AC | PRN

## 2017-10-22 MED ORDER — PROMETHAZINE HCL 12.5 MG PO TABS: 12.5000 mg | ORAL_TABLET | Freq: Four times a day (QID) | ORAL | 0 refills | Status: DC | PRN

## 2017-10-22 MED ORDER — LIDOCAINE HCL (PF) 1 % IJ SOLN
INTRAMUSCULAR | Status: AC
  Filled 2017-10-22: qty 5

## 2017-10-22 MED ORDER — SEVOFLURANE IN SOLN
RESPIRATORY_TRACT | Status: AC
  Filled 2017-10-22: qty 250

## 2017-10-22 MED ORDER — CEFAZOLIN SODIUM-DEXTROSE 2-4 GM/100ML-% IV SOLN
INTRAVENOUS | Status: AC
  Administered 2017-10-22: 2 g via INTRAVENOUS
  Filled 2017-10-22: qty 100

## 2017-10-22 MED ORDER — SUCCINYLCHOLINE CHLORIDE 20 MG/ML IJ SOLN
INTRAMUSCULAR | Status: DC | PRN
  Administered 2017-10-22: 100 mg via INTRAVENOUS

## 2017-10-22 MED ORDER — ROCURONIUM BROMIDE 100 MG/10ML IV SOLN
INTRAVENOUS | Status: DC | PRN
  Administered 2017-10-22: 40 mg via INTRAVENOUS
  Administered 2017-10-22: 50 mg via INTRAVENOUS
  Administered 2017-10-22: 10 mg via INTRAVENOUS

## 2017-10-22 MED ORDER — FENTANYL CITRATE (PF) 100 MCG/2ML IJ SOLN
INTRAMUSCULAR | Status: DC | PRN
  Administered 2017-10-22: 25 ug via INTRAVENOUS
  Administered 2017-10-22: 50 ug via INTRAVENOUS
  Administered 2017-10-22: 25 ug via INTRAVENOUS

## 2017-10-22 SURGICAL SUPPLY — 72 items
ANCHOR 4.5 FOOTPRINT ULTRA (Anchor) ×2 IMPLANT
BAG COUNTER SPONGE EZ (MISCELLANEOUS) IMPLANT
BANDAGE ELASTIC 4 LF NS (GAUZE/BANDAGES/DRESSINGS) ×4 IMPLANT
BLADE MED AGGRESSIVE (BLADE) IMPLANT
BLADE SAW LAPIPLASTY 40X11 (INSTRUMENTS) ×2 IMPLANT
BLADE SURG 15 STRL LF DISP TIS (BLADE) ×2 IMPLANT
BLADE SURG 15 STRL SS (BLADE) ×2
BLADE SURG MINI STRL (BLADE) ×2 IMPLANT
BNDG COHESIVE 4X5 TAN STRL (GAUZE/BANDAGES/DRESSINGS) ×2 IMPLANT
BNDG CONFORM 3 STRL LF (GAUZE/BANDAGES/DRESSINGS) ×2 IMPLANT
BNDG ESMARK 4X12 TAN STRL LF (GAUZE/BANDAGES/DRESSINGS) ×2 IMPLANT
BNDG ESMARK 6X12 TAN STRL LF (GAUZE/BANDAGES/DRESSINGS) ×2 IMPLANT
BNDG GAUZE 4.5X4.1 6PLY STRL (MISCELLANEOUS) ×2 IMPLANT
BUR 4X45 EGG (BURR) IMPLANT
CANISTER SUCT 1200ML W/VALVE (MISCELLANEOUS) ×2 IMPLANT
CONTROL 360 (Bone Implant) ×2 IMPLANT
COVER PIN YLW 0.028-062 (MISCELLANEOUS) IMPLANT
CUFF TOURN 18 STER (MISCELLANEOUS) ×2 IMPLANT
DRAPE EXTREMITY 106X87X128.5 (DRAPES) ×2 IMPLANT
DRAPE FLUOR MINI C-ARM 54X84 (DRAPES) ×4 IMPLANT
DURAPREP 26ML APPLICATOR (WOUND CARE) ×2 IMPLANT
ELECT REM PT RETURN 9FT ADLT (ELECTROSURGICAL) ×2
ELECTRODE REM PT RTRN 9FT ADLT (ELECTROSURGICAL) ×1 IMPLANT
GAUZE PETRO XEROFOAM 1X8 (MISCELLANEOUS) ×2 IMPLANT
GAUZE SPONGE 4X4 12PLY STRL (GAUZE/BANDAGES/DRESSINGS) ×2 IMPLANT
GAUZE STRETCH 2X75IN STRL (MISCELLANEOUS) ×2 IMPLANT
GLOVE BIO SURGEON STRL SZ7.5 (GLOVE) ×8 IMPLANT
GLOVE INDICATOR 8.0 STRL GRN (GLOVE) ×4 IMPLANT
GOWN STRL REUS W/ TWL LRG LVL3 (GOWN DISPOSABLE) ×3 IMPLANT
GOWN STRL REUS W/TWL LRG LVL3 (GOWN DISPOSABLE) ×3
HANDLE YANKAUER SUCT BULB TIP (MISCELLANEOUS) ×2 IMPLANT
KIT TURNOVER KIT A (KITS) ×2 IMPLANT
LABEL OR SOLS (LABEL) ×2 IMPLANT
NDL MAYO CATGUT SZ5 (NEEDLE)
NDL SUT 5 .5 CRC TPR PNT MAYO (NEEDLE) IMPLANT
NEEDLE FILTER BLUNT 18X 1/2SAF (NEEDLE) ×1
NEEDLE FILTER BLUNT 18X1 1/2 (NEEDLE) ×1 IMPLANT
NEEDLE HYPO 25X1 1.5 SAFETY (NEEDLE) ×6 IMPLANT
NS IRRIG 500ML POUR BTL (IV SOLUTION) ×2 IMPLANT
PACK EXTREMITY ARMC (MISCELLANEOUS) ×2 IMPLANT
PAD CAST CTTN 4X4 STRL (SOFTGOODS) ×1 IMPLANT
PADDING CAST COTTON 4X4 STRL (SOFTGOODS) ×1
PENCIL ELECTRO HAND CTR (MISCELLANEOUS) ×2 IMPLANT
RASP SM TEAR CROSS CUT (RASP) ×2 IMPLANT
SPLINT CAST 1 STEP 4X30 (MISCELLANEOUS) IMPLANT
SPLINT CAST 1 STEP 5X30 WHT (MISCELLANEOUS) IMPLANT
SPLINT FAST PLASTER 5X30 (CAST SUPPLIES)
SPLINT PLASTER CAST FAST 5X30 (CAST SUPPLIES) IMPLANT
SPONGE LAP 18X18 RF (DISPOSABLE) ×2 IMPLANT
STOCKINETTE M/LG 89821 (MISCELLANEOUS) ×2 IMPLANT
STOCKINETTE STRL 6IN 960660 (GAUZE/BANDAGES/DRESSINGS) ×2 IMPLANT
STRAP SAFETY 5IN WIDE (MISCELLANEOUS) ×2 IMPLANT
STRIP CLOSURE SKIN 1/2X4 (GAUZE/BANDAGES/DRESSINGS) ×2 IMPLANT
STRIP CLOSURE SKIN 1/4X4 (GAUZE/BANDAGES/DRESSINGS) ×2 IMPLANT
SUT ETHILON NAB PS2 4-0 18IN (SUTURE) ×2 IMPLANT
SUT MNCRL+ 5-0 UNDYED PC-3 (SUTURE) ×1 IMPLANT
SUT MNCRL+ 5-0 VIOLET P-3 (SUTURE) ×1 IMPLANT
SUT MONOCRYL 5-0 (SUTURE) ×2
SUT PDS AB 0 CT1 27 (SUTURE) IMPLANT
SUT VIC AB 0 SH 27 (SUTURE) ×2 IMPLANT
SUT VIC AB 2-0 SH 27 (SUTURE) ×2
SUT VIC AB 2-0 SH 27XBRD (SUTURE) ×2 IMPLANT
SUT VIC AB 3-0 SH 27 (SUTURE) ×1
SUT VIC AB 3-0 SH 27X BRD (SUTURE) ×1 IMPLANT
SUT VIC AB 4-0 FS2 27 (SUTURE) ×2 IMPLANT
SUT VICRYL AB 3-0 FS1 BRD 27IN (SUTURE) ×2 IMPLANT
SWABSTK COMLB BENZOIN TINCTURE (MISCELLANEOUS) ×6 IMPLANT
SYR 10ML LL (SYRINGE) ×4 IMPLANT
SYR 3ML LL SCALE MARK (SYRINGE) ×2 IMPLANT
WIRE MAGNUM (SUTURE) ×2 IMPLANT
WIRE Z .045 C-WIRE SPADE TIP (WIRE) IMPLANT
WIRE Z .062 C-WIRE SPADE TIP (WIRE) IMPLANT

## 2017-10-22 NOTE — Op Note (Signed)
Operative note   Surgeon:Evett Kassa Lawyer: None    Preop diagnosis: 1.  Left Achilles tendinitis 2.  Left calcaneal exostosis 3.  Left foot hallux valgus    Postop diagnosis: Same    Procedure:1.  Repair left Achilles tendinitis 2.  Exostectomy left calcaneus 3.  Lapidus hallux valgus correction left foot     EBL: Minimal    Anesthesia:regional and general    Hemostasis: Epinephrine infiltrated along the incision site of the posterior Achilles region.  Mid calf tourniquet inflated to 200 mmHg for approximately 110 minutes for the Lapidus procedure    Specimen: Posterior calcaneal bone spur    Complications: None    Operative indications:Becky Lutz is an 52 y.o. that presents today for surgical intervention.  The risks/benefits/alternatives/complications have been discussed and consent has been given.    Procedure:  Patient was brought into the OR and placed on the operating table in theprone position. After anesthesia was obtained theleft lower extremity was prepped and draped in usual sterile fashion.  Attention was directed to the posterior aspect of the left heel at the calcaneus and the Achilles insertion where a longitudinal incision was made.  Sharp and blunt dissection carried down to the peritenon.  The peritenon was then incised and reflected.  A longitudinal tendon splitting incision was made through the tendon at the calcaneus down to the distal portion of the calcaneus.  The tendon was then dissected away from the posterior calcaneal bone spur.  There was noted to be a very large posterior calcaneal spur encompassing from medial to lateral.  Next with a combination of osteotomes and a power rasp I was able to remove all of the abnormal bone spur.  The wound was flushed with copious amounts of irrigation.  No residual spur was noted.  At this time attention was directed to the distal tendon where noted fibrotic nonviable tissue was found at the Achilles  insertional site.  This was then excised with a 15 blade and removed.  The wound was finally flushed.  The tendon was initially repaired with a 2-0 Vicryl.  Next the tendon was reattached to the posterior calcaneus.  This was performed with a [employer] 5.5 mm tendon bone anchor.  This was taken directly against the calcaneus.  Good alignment was noted.  This was sutured into the tendon bone anchor with a #2 Magnum wire.  Final irrigation was performed.  Closure was performed with a combination of 3-0 Vicryl for the deeper and subcutaneous tissues and a 5-0 Monocryl for skin.  This was then covered and kept sterile.  Patient was then flipped over into the supine position for the next procedure.  After inflation of the tourniquet in the supine position attention was directed to the dorsal first met cuneiform joint.  Sharp and blunt dissection carried down to the joint.  The joint was then freed at this time.  Next the joint positioner was placed from the [place] set.  This realign the first MTPJ.  The cut guides were placed into the joint and a closing wedge cut was made off the base of the metatarsal and distal medial cuneiform.  Good removal of bone was noted.  Good realignment was noted after compression of the site.  There was still some residual valgus of the great toe.  Attention was directed to the first MTPJ where dorsal medial incision was performed.  Sharp and blunt dissection carried down to the capsule.  The intermetatarsal  space was entered the DTI L was transected.  The conjoined tendon of the abductor was released off the base of the proximal phalanx.  Letter realignment of the MTPJ was noted with better excursion and laxity of the joint.  Attention was redirected to the first met cuneiform joint where the joint was then prepared with a 2 oh drill bit.  Compression was applied.  A medial and dorsal locking plate from the lapiplasty set was placed with standard fashion.  Good stability was  noted and good realignment was noted.  Attention was redirected to the MTPJ where a T capsulotomy was performed.  The dorsomedial eminence was noted and transected with a power saw.  This was then smoothed with a power rasp.  The remainder of the joint was intact without signs of arthritic changes.  A small capsulorrhaphy was performed medially.  The deeper tissue and capsule was closed with a 3-0 Vicryl.  Subtenons tissues were closed with a 4-0 Vicryl and the skin with a 5-0 Monocryl.  A small stab incision that was made for the joint positioner was closed with a 4-0 nylon.  All areas were infiltrated with 0.25% bupivacaine.  A total of 20 cc was used.  Patient was placed in a well compressive sterile dressing and then placed in an equalizer walker boot.  She will remain nonweightbearing.    Patient tolerated the procedure and anesthesia well.  Was transported from the OR to the PACU with all vital signs stable and vascular status intact. To be discharged per routine protocol.  Will follow up in approximately 1 week in the outpatient clinic.

## 2017-10-22 NOTE — Anesthesia Post-op Follow-up Note (Signed)
Anesthesia QCDR form completed.        

## 2017-10-22 NOTE — Anesthesia Preprocedure Evaluation (Signed)
Anesthesia Evaluation  Patient identified by MRN, date of birth, ID band Patient awake    Reviewed: Allergy & Precautions, NPO status , Patient's Chart, lab work & pertinent test results  History of Anesthesia Complications (+) Family history of anesthesia reactionNegative for: history of anesthetic complications (nephew with PONV)  Airway Mallampati: III       Dental   Pulmonary sleep apnea and Continuous Positive Airway Pressure Ventilation , neg COPD,           Cardiovascular (-) hypertension(-) Past MI and (-) CHF (-) dysrhythmias (-) Valvular Problems/Murmurs     Neuro/Psych neg Seizures Anxiety    GI/Hepatic Neg liver ROS, neg GERD  ,  Endo/Other  neg diabetes  Renal/GU negative Renal ROS     Musculoskeletal   Abdominal   Peds  Hematology   Anesthesia Other Findings   Reproductive/Obstetrics                             Anesthesia Physical Anesthesia Plan  ASA: III  Anesthesia Plan: General   Post-op Pain Management: GA combined w/ Regional for post-op pain   Induction: Intravenous  PONV Risk Score and Plan:   Airway Management Planned: Oral ETT  Additional Equipment:   Intra-op Plan:   Post-operative Plan:   Informed Consent: I have reviewed the patients History and Physical, chart, labs and discussed the procedure including the risks, benefits and alternatives for the proposed anesthesia with the patient or authorized representative who has indicated his/her understanding and acceptance.     Plan Discussed with:   Anesthesia Plan Comments:         Anesthesia Quick Evaluation

## 2017-10-22 NOTE — OR Nursing (Signed)
Discharge instructions discussed with pt and wife. Both voice understanding. 

## 2017-10-22 NOTE — Progress Notes (Signed)
Pt c/o of back pain and nausea.   2cc of Fentanyl given instead of 2cc of Zofran.  Anesthesia notified. Cont to monitor respirations.  Pt alert, RR 16, vital signs remain stable. Will watch for an hour to monitor.  13:34  Pt cont to c/o nausea even after Zofran given.  Denies pain except just wanting to get up out of the stretcher.  Family updated.   Bubba Camp, RN

## 2017-10-22 NOTE — Anesthesia Postprocedure Evaluation (Signed)
Anesthesia Post Note  Patient: Becky Lutz  Procedure(s) Performed: HALLUX VALGUS LAPIDUS (Left ) ACHILLES TENDON REPAIR-SECONDARY (Left ) OSTECTOMY-HAGLUNDS/RECTROCALCANEAL (Left )  Patient location during evaluation: PACU Anesthesia Type: General Level of consciousness: awake and alert Pain management: pain level controlled Vital Signs Assessment: post-procedure vital signs reviewed and stable Respiratory status: spontaneous breathing and respiratory function stable Cardiovascular status: stable Anesthetic complications: no     Last Vitals:  Vitals:   10/22/17 1145 10/22/17 1200  BP: 117/77 126/89  Pulse: 89 97  Resp: 14 16  Temp: 36.4 C   SpO2: 99% 99%    Last Pain:  Vitals:   10/22/17 1145  TempSrc:   PainSc: Asleep                 KEPHART,WILLIAM K

## 2017-10-22 NOTE — H&P (Signed)
HISTORY AND PHYSICAL INTERVAL NOTE:  10/22/2017  7:19 AM  Becky Lutz  has presented today for surgery, with the diagnosis of Achilles Tendintis-Left Hallux Valgas-Left Calcaneal Spur-Left.  The various methods of treatment have been discussed with the patient.  No guarantees were given.  After consideration of risks, benefits and other options for treatment, the patient has consented to surgery.  I have reviewed the patients' chart and labs.     A history and physical examination was performed in my office.  The patient was reexamined.  There have been no changes to this history and physical examination.  [name] A

## 2017-10-22 NOTE — Anesthesia Procedure Notes (Signed)
Procedure Name: Intubation Date/Time: 10/22/2017 7:40 AM Performed by: Nelda Marseille, CRNA Pre-anesthesia Checklist: Patient identified, Patient being monitored, Timeout performed, Emergency Drugs available and Suction available Patient Re-evaluated:Patient Re-evaluated prior to induction Oxygen Delivery Method: Circle system utilized Preoxygenation: Pre-oxygenation with 100% oxygen Induction Type: IV induction Ventilation: Mask ventilation without difficulty Laryngoscope Size: Mac, 3 and McGraph Grade View: Grade I Tube type: Oral Tube size: 7.0 mm Number of attempts: 1 Airway Equipment and Method: Stylet and Video-laryngoscopy Placement Confirmation: ETT inserted through vocal cords under direct vision,  positive ETCO2 and breath sounds checked- equal and bilateral Secured at: 21 cm Tube secured with: Tape Dental Injury: Teeth and Oropharynx as per pre-operative assessment

## 2017-10-22 NOTE — Transfer of Care (Signed)
Immediate Anesthesia Transfer of Care Note  Patient: Becky Lutz  Procedure(s) Performed: HALLUX VALGUS LAPIDUS (Left ) ACHILLES TENDON REPAIR-SECONDARY (Left ) OSTECTOMY-HAGLUNDS/RECTROCALCANEAL (Left )  Patient Location: PACU  Anesthesia Type:General  Level of Consciousness: sedated  Airway & Oxygen Therapy: Patient Spontanous Breathing and Patient connected to face mask oxygen  Post-op Assessment: Report given to RN and Post -op Vital signs reviewed and stable  Post vital signs: Reviewed and stable  Last Vitals:  Vitals Value Taken Time  BP    Temp    Pulse    Resp    SpO2      Last Pain:  Vitals:   10/22/17 0726  TempSrc:   PainSc: 0-No pain         Complications: No apparent anesthesia complications

## 2017-10-22 NOTE — Discharge Instructions (Addendum)
[facility] °[facility] ° °POST OPERATIVE INSTRUCTIONS FOR DR. TROXLER AND DR. FOWLER °[facility] ° ° °1. Take your medication as prescribed.  Pain medication should be taken only as needed. ° °2. Keep the dressing clean, dry and intact. ° °3. Keep your foot elevated above the heart level for the first 48 hours. ° °4. Walking to the bathroom and brief periods of walking are acceptable, unless we have instructed you to be non-weight bearing. ° °5. Always wear your post-op shoe when walking.  Always use your crutches if you are to be non-weight bearing. ° °6. Do not take a shower. Baths are permissible as long as the foot is kept out of the water.  ° °7. Every hour you are awake:  °- Bend your knee 15 times. ° °8. Call [facility] ([phone]) if any of the following problems occur: °- You develop a temperature or fever. °- The bandage becomes saturated with blood. °- Medication does not stop your pain. °- Injury of the foot occurs. °- Any symptoms of infection including redness, odor, or red streaks running from wound. ° ° ° °AMBULATORY SURGERY  °DISCHARGE INSTRUCTIONS ° ° °1) The drugs that you were given will stay in your system until tomorrow so for the next 24 hours you should not: ° °A) Drive an automobile °B) Make any legal decisions °C) Drink any alcoholic beverage ° ° °2) You may resume regular meals tomorrow.  Today it is better to start with liquids and gradually work up to solid foods. ° °You may eat anything you prefer, but it is better to start with liquids, then soup and crackers, and gradually work up to solid foods. ° ° °3) Please notify your doctor immediately if you have any unusual bleeding, trouble breathing, redness and pain at the surgery site, drainage, fever, or pain not relieved by medication. ° ° ° °4) Additional Instructions: ° ° ° ° ° ° ° °Please contact your physician with any problems or Same Day Surgery at [phone],  Monday through Friday 6 am to 4 pm, or [facility] Main number at [phone]. ° °

## 2017-10-27 LAB — SURGICAL PATHOLOGY

## 2017-11-12 ENCOUNTER — Emergency Department
Admission: EM | Admit: 2017-11-12 | Discharge: 2017-11-13 | Disposition: A | Discharge status: Home / Self Care | Payer: BLUE CROSS/BLUE SHIELD | Attending: Emergency Medicine | Admitting: Emergency Medicine

## 2017-11-12 ENCOUNTER — Other Ambulatory Visit: Payer: Self-pay

## 2017-11-12 DIAGNOSIS — Y658 Other specified misadventures during surgical and medical care: Secondary | ICD-10-CM | POA: Insufficient documentation

## 2017-11-12 DIAGNOSIS — Z79899 Other long term (current) drug therapy: Secondary | ICD-10-CM | POA: Diagnosis not present

## 2017-11-12 DIAGNOSIS — Y999 Unspecified external cause status: Secondary | ICD-10-CM | POA: Diagnosis not present

## 2017-11-12 DIAGNOSIS — Y92000 Kitchen of unspecified non-institutional (private) residence as  the place of occurrence of the external cause: Secondary | ICD-10-CM | POA: Insufficient documentation

## 2017-11-12 DIAGNOSIS — R7303 Prediabetes: Secondary | ICD-10-CM | POA: Diagnosis not present

## 2017-11-12 DIAGNOSIS — W010XXA Fall on same level from slipping, tripping and stumbling without subsequent striking against object, initial encounter: Secondary | ICD-10-CM | POA: Diagnosis not present

## 2017-11-12 DIAGNOSIS — S99922A Unspecified injury of left foot, initial encounter: Secondary | ICD-10-CM | POA: Insufficient documentation

## 2017-11-12 DIAGNOSIS — S91309A Unspecified open wound, unspecified foot, initial encounter: Secondary | ICD-10-CM

## 2017-11-12 DIAGNOSIS — T8131XA Disruption of external operation (surgical) wound, not elsewhere classified, initial encounter: Secondary | ICD-10-CM | POA: Insufficient documentation

## 2017-11-12 DIAGNOSIS — Y9389 Activity, other specified: Secondary | ICD-10-CM | POA: Diagnosis not present

## 2017-11-12 DIAGNOSIS — W19XXXA Unspecified fall, initial encounter: Secondary | ICD-10-CM

## 2017-11-12 NOTE — ED Triage Notes (Signed)
Pt arrives to ED via POV from home with c/o left foot pain and bleeding s/p injury. Pt is 3 weeks post-op and non-weight bearing, states she lost her balance and put weight on her foot causing the stitches to break open.

## 2017-11-12 NOTE — ED Notes (Signed)
Pt states she has had no complications, signs of infection, etc., with the affected foot since surgery other than her injury tonight. With that information, pt's acuity will be changed to a level 4.

## 2017-11-13 ENCOUNTER — Emergency Department: Payer: BLUE CROSS/BLUE SHIELD

## 2017-11-13 MED ORDER — CYCLOBENZAPRINE HCL 10 MG PO TABS
5.0000 mg | ORAL_TABLET | Freq: Once | ORAL | Status: AC
  Administered 2017-11-13: 5 mg via ORAL
  Filled 2017-11-13: qty 1

## 2017-11-13 MED ORDER — BACITRACIN-NEOMYCIN-POLYMYXIN 400-5-5000 EX OINT
TOPICAL_OINTMENT | CUTANEOUS | Status: AC
  Administered 2017-11-13: 1
  Filled 2017-11-13: qty 1

## 2017-11-13 NOTE — ED Provider Notes (Signed)
[facility] Emergency Department Provider Note   ____________________________________________   First MD Initiated Contact with Patient 11/13/17 0016     (approximate)  I have reviewed the triage vital signs and the nursing notes.   HISTORY  Chief Complaint Foot Injury    HPI Becky Lutz is a 52 y.o. female who presents to the ED from home status post fall with left foot and ankle pain.  Patient is 3 weeks status post left Lapidus hallux valgus correction as well as calcaneal exostectomy and Achilles tendon repair.  She is still on nonweightbearing status.  States she got up from the kitchen table, tripped and put her left foot down instinctively to prevent her from falling.  She put weight on her left foot and split the incision to the back of the heel.  Sutures were removed last week.  Presents with pain in her left foot and ankle as well as wound to the left heel.  Denies striking head or LOC.  Denies chest pain, shortness of breath, abdominal pain, nausea or vomiting.   Past Medical History:  Diagnosis Date  . Anemia    AT AGE 42 AFTER GIVING BLOOD  . Anxiety   . Complication of anesthesia    WAKES UP FIGHTING  . Family history of adverse reaction to anesthesia    NEPHEW WAKES UP FIGHTING DUE TO VERSED  . Headache   . Pre-diabetes   . Sleep apnea    CPAP    Patient Active Problem List   Diagnosis Date Noted  . Rectal bleeding     Past Surgical History:  Procedure Laterality Date  . ABDOMINAL HYSTERECTOMY     2006  . ACHILLES TENDON SURGERY Left 10/22/2017   Procedure: ACHILLES TENDON REPAIR-SECONDARY;  Surgeon: [name], DPM;  Location: [facility] ORS;  Service: Podiatry;  Laterality: Left;  . arm surgery move nerve  Right   . CESAREAN SECTION    . CHOLECYSTECTOMY     1996  . COLONOSCOPY WITH PROPOFOL N/A 08/16/2017   Procedure: COLONOSCOPY WITH PROPOFOL;  Surgeon: Lin Landsman, MD;  Location: [facility] ENDOSCOPY;  Service:  Gastroenterology;  Laterality: N/A;  . HALLUX VALGUS LAPIDUS Left 10/22/2017   Procedure: HALLUX VALGUS LAPIDUS;  Surgeon: [name], DPM;  Location: [facility] ORS;  Service: Podiatry;  Laterality: Left;  . OSTECTOMY Left 10/22/2017   Procedure: OSTECTOMY-HAGLUNDS/RECTROCALCANEAL;  Surgeon: [name], DPM;  Location: [facility] ORS;  Service: Podiatry;  Laterality: Left;    Prior to Admission medications   Medication Sig Start Date End Date Taking? Authorizing Provider  acetaminophen (TYLENOL) 500 MG tablet Take 1,000 mg by mouth every 6 (six) hours as needed.    [provider]  Aspirin-Acetaminophen-Caffeine (EXCEDRIN MIGRAINE PO) Take 1-2 tablets by mouth as needed.    [provider]  dextroamphetamine (DEXTROSTAT) 5 MG tablet Take 5 mg by mouth See admin instructions. Pt takes daily, and may take additional dose if needed for sleepiness 06/11/17   [provider]  DULoxetine (CYMBALTA) 60 MG capsule Take 60 mg by mouth every morning.  03/05/17   [provider]  ketorolac (TORADOL) 10 MG tablet Take 10 mg by mouth every 8 (eight) hours as needed (migraines).  04/08/17   [provider]  Multiple Vitamins-Minerals (MULTIVITAMIN WITH MINERALS) tablet Take 1 tablet by mouth daily.    [provider]  naproxen sodium (ALEVE) 220 MG tablet Take 220 mg by mouth 2 (two) times daily as needed (migraines).  [provider]  promethazine (PHENERGAN) 12.5 MG tablet Take 1 tablet (12.5 mg total) by mouth every 6 (six) hours as needed for nausea. 10/22/17   [name], DPM  tapentadol (NUCYNTA) 50 MG tablet Take 1 tablet (50 mg total) by mouth every 4 (four) hours as needed for up to 20 doses. 10/22/17   [name], DPM  traMADol (ULTRAM) 50 MG tablet Take 1 tablet (50 mg total) by mouth every 6 (six) hours as needed. 10/22/17 10/22/18  [name], DPM  vitamin B-12 (CYANOCOBALAMIN) 1000 MCG tablet Take 1,000 mcg by mouth daily.     [provider]  vitamin C (ASCORBIC ACID) 500 MG tablet Take 500 mg by mouth daily.    [provider]    Allergies Codeine  Family History  Problem Relation Age of Onset  . Ovarian cancer Mother 25       not sure ovarian or uterine     Social History Social History   Tobacco Use  . Smoking status: Never Smoker  . Smokeless tobacco: Never Used  Substance Use Topics  . Alcohol use: Yes    Comment: RARE 1-2 YEARLY  . Drug use: Never    Review of Systems  Constitutional: No fever/chills Eyes: No visual changes. ENT: No sore throat. Cardiovascular: Denies chest pain. Respiratory: Denies shortness of breath. Gastrointestinal: No abdominal pain.  No nausea, no vomiting.  No diarrhea.  No constipation. Genitourinary: Negative for dysuria. Musculoskeletal: Positive for left ankle, foot pain and swelling. Skin: Negative for rash. Neurological: Negative for headaches, focal weakness or numbness.   ____________________________________________   PHYSICAL EXAM:  VITAL SIGNS: ED Triage Vitals  Enc Vitals Group     BP 11/12/17 2150 (!) 127/111     Pulse Rate 11/12/17 2150 98     Resp 11/12/17 2150 18     Temp 11/12/17 2150 98.2 F (36.8 C)     Temp Source 11/12/17 2150 Oral     SpO2 11/12/17 2150 96 %     Weight 11/12/17 2148 237 lb (107.5 kg)     Height 11/12/17 2148 5\' 6"  (1.676 m)     Head Circumference --      Peak Flow --      Pain Score 11/12/17 2148 9     Pain Loc --      Pain Edu? --      Excl. in [place]? --     Constitutional: Alert and oriented. Well appearing and in mild acute distress. Eyes: Conjunctivae are normal. PERRL. EOMI. Head: Atraumatic. Nose: No congestion/rhinnorhea. Mouth/Throat: Mucous membranes are moist.  Oropharynx non-erythematous. Neck: No stridor.  No cervical spine tenderness to palpation. Cardiovascular: Normal rate, regular rhythm. Grossly normal heart sounds.  Good peripheral circulation. Respiratory: Normal  respiratory effort.  No retractions. Lungs CTAB. Gastrointestinal: Soft and nontender. No distention. No abdominal bruits. No CVA tenderness. Musculoskeletal:  LLE: Mild to moderate swelling to the left foot and ankle.  Limited range of motion to ankle secondary to pain.  Incision intact to dorsal foot.  Small, superficial avulsion type wound to incision at calcaneus without active bleeding.  2+ distal pulses.  Brisk, less than 5-second capillary refill. Neurologic:  Normal speech and language. No gross focal neurologic deficits are appreciated.  Skin:  Skin is warm, dry and intact. No rash noted. Psychiatric: Mood and affect are normal. Speech and behavior are normal.  ____________________________________________   LABS (all labs ordered are listed, but only abnormal results are displayed)  Labs  Reviewed - No data to display ____________________________________________  EKG  None ____________________________________________  RADIOLOGY  ED MD interpretation: Hardware intact; no acute fracture dislocation  Official radiology report(s): Dg Ankle Complete Left  Result Date: 11/13/2017 CLINICAL DATA:  Fall, status post surgery 3 weeks ago. EXAM: LEFT ANKLE COMPLETE - 3+ VIEW; LEFT FOOT - COMPLETE 3+ VIEW COMPARISON:  None. FINDINGS: RIGHT foot: No acute fracture deformity or dislocation. Status post first metatarsal head osteotomy, first metatarsal and medial cuneiform plate and screw fixation. First and second metatarsal pin tracks. 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 No destructive bony lesions. Dorsal foot soft tissue swelling without subcutaneous gas or radiopaque foreign bodies. Ankle: No fracture deformity nor dislocation. Old corticated fragmentation about the medial and lateral malleoli. Mild degenerative change of the ankle. The ankle mortise appears congruent and the tibiofibular syndesmosis intact. No destructive bony lesions. Soft tissue planes are  non-suspicious. IMPRESSION: 1. Status post first metatarsal osteotomy and first TMT plate and screw fixation. No radiographic findings of hardware failure. 2. No acute fracture deformity or dislocation. Electronically Signed   By: Elon Alas M.D.   On: 11/13/2017 00:57   Dg Foot Complete Left  Result Date: 11/13/2017 CLINICAL DATA:  Fall, status post surgery 3 weeks ago. EXAM: LEFT ANKLE COMPLETE - 3+ VIEW; LEFT FOOT - COMPLETE 3+ VIEW COMPARISON:  None. FINDINGS: RIGHT foot: No acute fracture deformity or dislocation. Status post first metatarsal head osteotomy, first metatarsal and medial cuneiform plate and screw fixation. First and second metatarsal pin tracks. 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 No destructive bony lesions. Dorsal foot soft tissue swelling without subcutaneous gas or radiopaque foreign bodies. Ankle: No fracture deformity nor dislocation. Old corticated fragmentation about the medial and lateral malleoli. Mild degenerative change of the ankle. The ankle mortise appears congruent and the tibiofibular syndesmosis intact. No destructive bony lesions. Soft tissue planes are non-suspicious. IMPRESSION: 1. Status post first metatarsal osteotomy and first TMT plate and screw fixation. No radiographic findings of hardware failure. 2. No acute fracture deformity or dislocation. Electronically Signed   By: Elon Alas M.D.   On: 11/13/2017 00:57    ____________________________________________   PROCEDURES  Procedure(s) performed: None  Procedures  Critical Care performed: No  ____________________________________________   INITIAL IMPRESSION / ASSESSMENT AND PLAN / ED COURSE  As part of my medical decision making, I reviewed the following data within the electronic MEDICAL RECORD NUMBER History obtained from family, Nursing notes reviewed and incorporated, Old chart reviewed, Radiograph reviewed and Notes from prior ED visits   52 year old female who  presents status post fall with small avulsion type wound to her calcaneus.  She is 3 weeks status post surgery with hardware and is concerned she has damaged the hardware.  Will obtain plain film x-rays of left foot and ankle.  Patient took tramadol prior to arrival.  She is complaining of some spasms in her foot and ankle.  Will add Flexeril for muscle spasms.   Clinical Course as of Nov 14 111  Sat Nov 13, 2017  0102 Updated patient and her family member of unremarkable x-rays.  Nursing to clean and dress wound.  She will resume nonweightbearing status and follow-up closely with her podiatrist next week.  Strict return precautions given.  Both verbalize understanding and agree with plan of care.   [JS]    Clinical Course User Index [JS] Paulette Blanch, MD     ____________________________________________   FINAL CLINICAL IMPRESSION(S) / ED  DIAGNOSES  Final diagnoses:  Injury of left foot, initial encounter  Fall, initial encounter  Wound of foot     ED Discharge Orders    None       Note:  This document was prepared using Dragon voice recognition software and may include unintentional dictation errors.   Paulette Blanch, MD 11/13/17 [phone]

## 2017-11-13 NOTE — Discharge Instructions (Signed)
1.  Please resume nonweightbearing status. 2.  Keep wound clean and dry. 3.  Return to the ER for worsening symptoms, redness/swelling, fever or other concerns.

## 2019-09-07 DIAGNOSIS — Z1389 Encounter for screening for other disorder: Secondary | ICD-10-CM | POA: Diagnosis not present

## 2019-09-07 DIAGNOSIS — F331 Major depressive disorder, recurrent, moderate: Secondary | ICD-10-CM | POA: Diagnosis not present

## 2019-09-07 DIAGNOSIS — F3342 Major depressive disorder, recurrent, in full remission: Secondary | ICD-10-CM | POA: Diagnosis not present

## 2019-09-07 DIAGNOSIS — F900 Attention-deficit hyperactivity disorder, predominantly inattentive type: Secondary | ICD-10-CM | POA: Diagnosis not present

## 2019-09-29 DIAGNOSIS — F411 Generalized anxiety disorder: Secondary | ICD-10-CM | POA: Diagnosis not present

## 2019-12-08 DIAGNOSIS — F900 Attention-deficit hyperactivity disorder, predominantly inattentive type: Secondary | ICD-10-CM | POA: Diagnosis not present

## 2019-12-08 DIAGNOSIS — Z1389 Encounter for screening for other disorder: Secondary | ICD-10-CM | POA: Diagnosis not present

## 2019-12-08 DIAGNOSIS — F3342 Major depressive disorder, recurrent, in full remission: Secondary | ICD-10-CM | POA: Diagnosis not present

## 2019-12-08 DIAGNOSIS — F331 Major depressive disorder, recurrent, moderate: Secondary | ICD-10-CM | POA: Diagnosis not present

## 2020-03-15 DIAGNOSIS — F331 Major depressive disorder, recurrent, moderate: Secondary | ICD-10-CM | POA: Diagnosis not present

## 2020-03-15 DIAGNOSIS — F3342 Major depressive disorder, recurrent, in full remission: Secondary | ICD-10-CM | POA: Diagnosis not present

## 2020-03-15 DIAGNOSIS — G4700 Insomnia, unspecified: Secondary | ICD-10-CM | POA: Diagnosis not present

## 2020-03-15 DIAGNOSIS — F900 Attention-deficit hyperactivity disorder, predominantly inattentive type: Secondary | ICD-10-CM | POA: Diagnosis not present

## 2020-03-15 DIAGNOSIS — Z79899 Other long term (current) drug therapy: Secondary | ICD-10-CM | POA: Diagnosis not present

## 2020-06-07 DIAGNOSIS — F3342 Major depressive disorder, recurrent, in full remission: Secondary | ICD-10-CM | POA: Diagnosis not present

## 2020-06-07 DIAGNOSIS — F900 Attention-deficit hyperactivity disorder, predominantly inattentive type: Secondary | ICD-10-CM | POA: Diagnosis not present

## 2020-06-07 DIAGNOSIS — F331 Major depressive disorder, recurrent, moderate: Secondary | ICD-10-CM | POA: Diagnosis not present

## 2020-09-11 DIAGNOSIS — F3342 Major depressive disorder, recurrent, in full remission: Secondary | ICD-10-CM | POA: Diagnosis not present

## 2020-09-11 DIAGNOSIS — F900 Attention-deficit hyperactivity disorder, predominantly inattentive type: Secondary | ICD-10-CM | POA: Diagnosis not present

## 2020-09-11 DIAGNOSIS — F331 Major depressive disorder, recurrent, moderate: Secondary | ICD-10-CM | POA: Diagnosis not present

## 2020-12-04 DIAGNOSIS — F900 Attention-deficit hyperactivity disorder, predominantly inattentive type: Secondary | ICD-10-CM | POA: Diagnosis not present

## 2020-12-04 DIAGNOSIS — F331 Major depressive disorder, recurrent, moderate: Secondary | ICD-10-CM | POA: Diagnosis not present

## 2021-04-30 DIAGNOSIS — F331 Major depressive disorder, recurrent, moderate: Secondary | ICD-10-CM | POA: Diagnosis not present

## 2021-07-12 ENCOUNTER — Other Ambulatory Visit: Payer: Self-pay

## 2021-07-12 ENCOUNTER — Emergency Department: Payer: BC Managed Care – PPO

## 2021-07-12 ENCOUNTER — Encounter: Payer: Self-pay | Admitting: Emergency Medicine

## 2021-07-12 ENCOUNTER — Emergency Department
Admission: EM | Admit: 2021-07-12 | Discharge: 2021-07-12 | Disposition: A | Discharge status: Home / Self Care | Payer: BC Managed Care – PPO | Attending: Emergency Medicine | Admitting: Emergency Medicine

## 2021-07-12 DIAGNOSIS — Z23 Encounter for immunization: Secondary | ICD-10-CM | POA: Insufficient documentation

## 2021-07-12 DIAGNOSIS — W540XXA Bitten by dog, initial encounter: Secondary | ICD-10-CM | POA: Diagnosis not present

## 2021-07-12 DIAGNOSIS — S61452A Open bite of left hand, initial encounter: Secondary | ICD-10-CM | POA: Diagnosis not present

## 2021-07-12 DIAGNOSIS — S61211A Laceration without foreign body of left index finger without damage to nail, initial encounter: Secondary | ICD-10-CM | POA: Insufficient documentation

## 2021-07-12 DIAGNOSIS — S6992XA Unspecified injury of left wrist, hand and finger(s), initial encounter: Secondary | ICD-10-CM | POA: Diagnosis not present

## 2021-07-12 DIAGNOSIS — Y93K9 Activity, other involving animal care: Secondary | ICD-10-CM | POA: Insufficient documentation

## 2021-07-12 DIAGNOSIS — S61251A Open bite of left index finger without damage to nail, initial encounter: Secondary | ICD-10-CM | POA: Diagnosis not present

## 2021-07-12 MED ORDER — LIDOCAINE HCL (PF) 1 % IJ SOLN
5.0000 mL | Freq: Once | INTRAMUSCULAR | Status: AC
  Administered 2021-07-12: 5 mL
  Filled 2021-07-12: qty 5

## 2021-07-12 MED ORDER — TETANUS-DIPHTH-ACELL PERTUSSIS 5-2.5-18.5 LF-MCG/0.5 IM SUSY
0.5000 mL | PREFILLED_SYRINGE | Freq: Once | INTRAMUSCULAR | Status: AC
  Administered 2021-07-12: 0.5 mL via INTRAMUSCULAR
  Filled 2021-07-12: qty 0.5

## 2021-07-12 MED ORDER — AMOXICILLIN-POT CLAVULANATE 875-125 MG PO TABS: 1.0000 | ORAL_TABLET | Freq: Two times a day (BID) | ORAL | 0 refills | Status: DC

## 2021-07-12 MED ORDER — ACETAMINOPHEN 325 MG PO TABS
650.0000 mg | ORAL_TABLET | Freq: Once | ORAL | Status: AC
  Administered 2021-07-12: 650 mg via ORAL
  Filled 2021-07-12: qty 2

## 2021-07-12 NOTE — Discharge Instructions (Addendum)
You were evaluated in the emergency department for a laceration and dog bite. It was repaired with 1 suture.  Please take the antibiotics as prescribed.  Keep the area clean and dry.  Wash multiple times per day with soap and water.  Do not go into the ocean or swimming pool.  Remember that this injury is high risk for infection given the dirty nature of dog's mouth's and the puncture wounds that you have sustained as well as the location.  Please return to the emergency department immediately for any signs of infection as we discussed.  Please also follow-up with the orthopedic physician.Your sutures will need to be removed in 7 days.Return to the emergency department for: ? ?-- Fever > 100.71F ?-- Increase pain in the wound ?-- Increase redness and swelling ?-- Pus coming from the wound ?-- Wound bleeds more than a small amount or it does not stop ?-- Wound edges come apart ?-- Severe pain ?-- Weakness or numbness in the affected area ? ?Or any other new or worsening symptoms. It was a pleasure caring for you. ? ?

## 2021-07-12 NOTE — ED Provider Notes (Signed)
? ?[facility] ?Provider Note ? ? ? Event Date/Time  ? First MD Initiated Contact with Patient 07/12/21 2135   ?  (approximate) ? ? ?History  ? ?Animal Bite ? ? ?HPI ? ?Becky Lutz is a 56 y.o. female right-hand-dominant who presents today for evaluation of the left index finger injury.  Patient reports that she was breaking up a fight between her dogs when her dog accidentally bit her on her finger.  She reports that she has pain over her PIP of her left index finger.  She is able to flex and extend fully and normally.  She reports that she has a couple of different puncture sites.  She is unsure of her last tetanus shot.  She reports that her dogs vaccinations ? ?Patient Active Problem List  ? Diagnosis Date Noted  ? Rectal bleeding   ? ?  ? ?  ? ? ?Physical Exam  ? ?Triage Vital Signs: ?ED Triage Vitals  ?Enc Vitals Group  ?   BP 07/12/21 2134 (!) 137/92  ?   Pulse Rate 07/12/21 2134 (!) 112  ?   Resp 07/12/21 2134 16  ?   Temp 07/12/21 2134 98.7 ?F (37.1 ?C)  ?   Temp Source 07/12/21 2134 Oral  ?   SpO2 07/12/21 2134 95 %  ?   Weight 07/12/21 2125 229 lb (103.9 kg)  ?   Height 07/12/21 2125 '5\' 6"'$  (1.676 m)  ?   Head Circumference --   ?   Peak Flow --   ?   Pain Score 07/12/21 2125 7  ?   Pain Loc --   ?   Pain Edu? --   ?   Excl. in [place]? --   ? ? ?Most recent vital signs: ?Vitals:  ? 07/12/21 2134  ?BP: (!) 137/92  ?Pulse: (!) 112  ?Resp: 16  ?Temp: 98.7 ?F (37.1 ?C)  ?SpO2: 95%  ? ? ?Physical Exam ?Vitals and nursing note reviewed.  ?Constitutional:   ?   General: Awake and alert. No acute distress. ?   Appearance: Normal appearance. He is well-developed and normal weight.  ?HENT:  ?   Head: Normocephalic and atraumatic.  ?   Mouth/Throat:  ?   Mouth: Mucous membranes are moist.  ?Eyes:  ?   General: PERRL. Normal EOMs     ?   Right eye: No discharge.     ?   Left eye: No discharge.  ?   Conjunctiva/sclera: Conjunctivae normal.  ?Cardiovascular:  ?   Rate and Rhythm: Normal rate  and regular rhythm.  ?   Pulses: Normal pulses.  ?   Heart sounds: Normal heart sounds ?Pulmonary:  ?   Effort: Pulmonary effort is normal. No respiratory distress.  ?   Breath sounds: Normal breath sounds.  ?Abdominal:  ?   Abdomen is soft. There is no abdominal tenderness. No rebound or guarding. No distention. ?Musculoskeletal:     ?   General: No swelling. Normal range of motion.  ?   Cervical back: Normal range of motion and neck supple.  ?Lymphadenopathy:  ?   Cervical: No cervical adenopathy.  ?Skin: ?   General: Skin is warm and dry.  ?   Capillary Refill: Capillary refill takes less than 2 seconds.  ?   Findings: No rash.  ?Left hand: Index finger with 1.5 cm laceration to the radial side of the index finger at the level of the PIP with visible subcutaneous fatty  tissue.  There is a 3 mm laceration to the ulnar side at the same level.  There is a 1 mm puncture wound to the pad of the finger.  There is no nailbed disruption or fingernail injury.  Patient is able to flex and extend and isolated PIP and DIP against resistance.  She has tenderness to palpation at the level of the PIP though has full and normal active and passive range of motion.  Sensation intact to light touch throughout the finger.  Wound fully probed and evaluated, no retained foreign body ?Neurological:  ?   Mental Status: He is alert.  ?  ? ? ?ED Results / Procedures / Treatments  ? ?Labs ?(all labs ordered are listed, but only abnormal results are displayed) ?Labs Reviewed - No data to display ? ? ?EKG ? ? ? ? ?RADIOLOGY ?I independently reviewed patient's x-ray which demonstrates no acute osseous injury ? ? ? ?PROCEDURES: ? ?Critical Care performed:  ? ?Procedures ? ? ?MEDICATIONS ORDERED IN ED: ?Medications  ?acetaminophen (TYLENOL) tablet 650 mg (650 mg Oral Given 07/12/21 2203)  ?Tdap (BOOSTRIX) injection 0.5 mL (0.5 mLs Intramuscular Given 07/12/21 2203)  ?lidocaine (PF) (XYLOCAINE) 1 % injection 5 mL (5 mLs Infiltration Given by Other  07/12/21 2258)  ? ? ? ?IMPRESSION / MDM / ASSESSMENT AND PLAN / ED COURSE  ?I reviewed the triage vital signs and the nursing notes. ? ? ?Differential diagnosis includes, but is not limited to, laceration, contusion, fracture, tendon injury, less likely dislocation.  Patient has full normal range of motion at PIP and DIP against resistance, do not suspect tendon rupture.  X-ray was obtained and demonstrates no acute osseous injury.  Patient has multiple puncture wounds, though she has a 1.5 cm laceration to the radial side of the index finger at the level of the PIP.  Wound was fully probed and evaluated, no retained foreign body.  Wounds were irrigated extensively and copiously in the emergency department.  We discussed at length the pros and cons of loose repair of the 1.5 cm laceration given the risk of infection and she would like to proceed with 1 suture.  This was placed.  We discussed very strict return precautions and the importance of very close outpatient follow-up for recheck.  She was instructed to follow-up with the orthopedist.  We discussed signs of wound infection, flexor tenosynovitis, and other complications from the dog bite.  The dog is up-to-date on its rabies vaccinations.  She was given an updated tetanus shot.  She was discharged on 10 days of antibiotics.  We discussed wound care.  Patient understands and agrees with plan.  Discharged in stable condition.  ? ? ?FINAL CLINICAL IMPRESSION(S) / ED DIAGNOSES  ? ?Final diagnoses:  ?Dog bite, initial encounter  ? ? ? ?Rx / DC Orders  ? ?ED Discharge Orders   ? ?      Ordered  ?  amoxicillin-clavulanate (AUGMENTIN) 875-125 MG tablet  2 times daily       ? 07/12/21 2257  ? ?  ?  ? ?  ? ? ? ?Note:  This document was prepared using Dragon voice recognition software and may include unintentional dictation errors. ?  ?Marquette Old, PA-C ?07/12/21 2313 ? ?  ?Vanessa Skedee, MD ?07/12/21 2354 ? ?

## 2021-07-12 NOTE — ED Triage Notes (Signed)
Pt to ED from home c/o dog bite to left index finger.  Puncture to left index finger around second knuckle.  Pt's dog, dog is up to date on shots.  Dog bite being reported from triage.  [facility] to follow up with patient at home tomorrow or Monday. ? ?[place]  ?[phone] best contact for patient. ?

## 2021-07-12 NOTE — ED Notes (Signed)
Left pointer finger laceration from dog bite. ?

## 2021-07-14 ENCOUNTER — Inpatient Hospital Stay (HOSPITAL_COMMUNITY)
Admission: EM | Admit: 2021-07-14 | Discharge: 2021-07-17 | DRG: 514 | Disposition: A | Discharge status: Home / Self Care | Payer: BC Managed Care – PPO | Attending: Internal Medicine | Admitting: Internal Medicine

## 2021-07-14 ENCOUNTER — Other Ambulatory Visit: Payer: Self-pay

## 2021-07-14 ENCOUNTER — Encounter (HOSPITAL_COMMUNITY): Payer: Self-pay

## 2021-07-14 DIAGNOSIS — M65949 Unspecified synovitis and tenosynovitis, unspecified hand: Secondary | ICD-10-CM | POA: Diagnosis present

## 2021-07-14 DIAGNOSIS — S61251A Open bite of left index finger without damage to nail, initial encounter: Secondary | ICD-10-CM | POA: Diagnosis present

## 2021-07-14 DIAGNOSIS — F988 Other specified behavioral and emotional disorders with onset usually occurring in childhood and adolescence: Secondary | ICD-10-CM

## 2021-07-14 DIAGNOSIS — E669 Obesity, unspecified: Secondary | ICD-10-CM | POA: Diagnosis present

## 2021-07-14 DIAGNOSIS — Z6836 Body mass index (BMI) 36.0-36.9, adult: Secondary | ICD-10-CM

## 2021-07-14 DIAGNOSIS — S61452A Open bite of left hand, initial encounter: Secondary | ICD-10-CM | POA: Diagnosis present

## 2021-07-14 DIAGNOSIS — M659 Synovitis and tenosynovitis, unspecified: Secondary | ICD-10-CM | POA: Diagnosis not present

## 2021-07-14 DIAGNOSIS — Z885 Allergy status to narcotic agent status: Secondary | ICD-10-CM | POA: Diagnosis not present

## 2021-07-14 DIAGNOSIS — M65842 Other synovitis and tenosynovitis, left hand: Secondary | ICD-10-CM | POA: Diagnosis not present

## 2021-07-14 DIAGNOSIS — G4730 Sleep apnea, unspecified: Secondary | ICD-10-CM | POA: Diagnosis present

## 2021-07-14 DIAGNOSIS — Z79899 Other long term (current) drug therapy: Secondary | ICD-10-CM

## 2021-07-14 DIAGNOSIS — W540XXA Bitten by dog, initial encounter: Secondary | ICD-10-CM | POA: Diagnosis not present

## 2021-07-14 DIAGNOSIS — L03012 Cellulitis of left finger: Secondary | ICD-10-CM | POA: Diagnosis not present

## 2021-07-14 DIAGNOSIS — L02512 Cutaneous abscess of left hand: Secondary | ICD-10-CM | POA: Diagnosis not present

## 2021-07-14 DIAGNOSIS — R739 Hyperglycemia, unspecified: Secondary | ICD-10-CM | POA: Diagnosis not present

## 2021-07-14 DIAGNOSIS — M65142 Other infective (teno)synovitis, left hand: Secondary | ICD-10-CM | POA: Diagnosis not present

## 2021-07-14 DIAGNOSIS — Z8041 Family history of malignant neoplasm of ovary: Secondary | ICD-10-CM | POA: Diagnosis not present

## 2021-07-14 DIAGNOSIS — F32A Depression, unspecified: Secondary | ICD-10-CM | POA: Diagnosis not present

## 2021-07-14 DIAGNOSIS — S61251D Open bite of left index finger without damage to nail, subsequent encounter: Secondary | ICD-10-CM | POA: Diagnosis not present

## 2021-07-14 HISTORY — DX: Depression, unspecified: F32.A

## 2021-07-14 HISTORY — DX: Other specified behavioral and emotional disorders with onset usually occurring in childhood and adolescence: F98.8

## 2021-07-14 LAB — COMPREHENSIVE METABOLIC PANEL
ALT: 26 U/L (ref 0–44)
AST: 29 U/L (ref 15–41)
Albumin: 3.8 g/dL (ref 3.5–5.0)
Alkaline Phosphatase: 61 U/L (ref 38–126)
Anion gap: 8 (ref 5–15)
BUN: 21 mg/dL — ABNORMAL HIGH (ref 6–20)
CO2: 22 mmol/L (ref 22–32)
Calcium: 9 mg/dL (ref 8.9–10.3)
Chloride: 107 mmol/L (ref 98–111)
Creatinine, Ser: 1.02 mg/dL — ABNORMAL HIGH (ref 0.44–1.00)
GFR, Estimated: >60 mL/min (ref >60)
Glucose, Bld: 115 mg/dL — ABNORMAL HIGH (ref 70–99)
Potassium: 4 mmol/L (ref 3.5–5.1)
Sodium: 137 mmol/L (ref 135–145)
Total Bilirubin: 0.6 mg/dL (ref 0.3–1.2)
Total Protein: 6.9 g/dL (ref 6.5–8.1)

## 2021-07-14 LAB — CBC WITH DIFFERENTIAL/PLATELET
Abs Immature Granulocytes: 0.02 10*3/uL (ref 0.00–0.07)
Basophils Absolute: 0 10*3/uL (ref 0.0–0.1)
Basophils Relative: 0 %
Eosinophils Absolute: 0.2 10*3/uL (ref 0.0–0.5)
Eosinophils Relative: 2 %
HCT: 41.5 % (ref 36.0–46.0)
Hemoglobin: 14.2 g/dL (ref 12.0–15.0)
Immature Granulocytes: 0 %
Lymphocytes Relative: 28 %
Lymphs Abs: 2.4 10*3/uL (ref 0.7–4.0)
MCH: 31.2 pg (ref 26.0–34.0)
MCHC: 34.2 g/dL (ref 30.0–36.0)
MCV: 91.2 fL (ref 80.0–100.0)
Monocytes Absolute: 0.7 10*3/uL (ref 0.1–1.0)
Monocytes Relative: 8 %
Neutro Abs: 5.3 10*3/uL (ref 1.7–7.7)
Neutrophils Relative %: 62 %
Platelets: 222 10*3/uL (ref 150–400)
RBC: 4.55 MIL/uL (ref 3.87–5.11)
RDW: 12.3 % (ref 11.5–15.5)
WBC: 8.7 10*3/uL (ref 4.0–10.5)
nRBC: 0 % (ref 0.0–0.2)

## 2021-07-14 LAB — LACTIC ACID, PLASMA: Lactic Acid, Venous: 0.8 mmol/L (ref 0.5–1.9)

## 2021-07-14 NOTE — ED Triage Notes (Signed)
Patient arrives to ED from home c/o dog bite to left index finger that happened on Saturday. Pt was evaluated and treated at [facility] on Saturday night. Dog was pt's dog; already reported to [employer] per pt. Pt states she is here to f/u as the swelling and redness on her finger has increased. Pt a&o x4, no acute distress. ?

## 2021-07-14 NOTE — ED Provider Triage Note (Signed)
Emergency Medicine Provider Triage Evaluation Note ? ?Becky Lutz , a 56 y.o. female  was evaluated in triage.  Pt complains of infected bite wound to left index finger that occurred Saturday.  Seen at [facility], had sutures done and started on Augmentin.  Her dogs are up-to-date on vaccines.  States since then she has had progressive worsening swelling and redness of the affected digit. ? ?Review of Systems  ?Positive: Wound infection ?Negative: fever ? ?Physical Exam  ?BP 126/82 (BP Location: Right Arm)   Pulse 82   Temp 98.5 ?F (36.9 ?C) (Oral)   Resp 19   Ht '5\' 6"'$  (1.676 m)   Wt 103.9 kg   SpO2 97%   BMI 36.96 kg/m?  ?Gen:   Awake, no distress   ?Resp:  Normal effort  ?MSK:   Moves extremities without difficulty ?Other:  Left index finger is diffusely swollen, does have sutures in place, redness extending into the dorsal aspect of left hand along the first and second metacarpals, unable to fully flex or extend affected digit ? ?Medical Decision Making  ?Medically screening exam initiated at 10:43 PM.  Appropriate orders placed.  MARISAL SWAREY was informed that the remainder of the evaluation will be completed by another provider, this initial triage assessment does not replace that evaluation, and the importance of remaining in the ED until their evaluation is complete. ? ?Dog bite.  Occurred Saturday and already on Augmentin but appears to have worsening infection.  She denies any fever and vitals are currently stable.  Will check labs, lactate, send cultures. ?  ?Larene Pickett, PA-C ?07/14/21 2244 ? ?

## 2021-07-15 ENCOUNTER — Inpatient Hospital Stay (HOSPITAL_COMMUNITY): Payer: BC Managed Care – PPO | Admitting: Certified Registered Nurse Anesthetist

## 2021-07-15 ENCOUNTER — Encounter (HOSPITAL_COMMUNITY): Payer: Self-pay | Admitting: Internal Medicine

## 2021-07-15 ENCOUNTER — Encounter (HOSPITAL_COMMUNITY)
Admission: EM | Disposition: A | Discharge status: Home / Self Care | Payer: Self-pay | Source: Home / Self Care | Attending: Internal Medicine

## 2021-07-15 ENCOUNTER — Other Ambulatory Visit: Payer: Self-pay

## 2021-07-15 DIAGNOSIS — M659 Synovitis and tenosynovitis, unspecified: Secondary | ICD-10-CM | POA: Diagnosis not present

## 2021-07-15 DIAGNOSIS — S61251A Open bite of left index finger without damage to nail, initial encounter: Secondary | ICD-10-CM | POA: Diagnosis present

## 2021-07-15 DIAGNOSIS — S61452A Open bite of left hand, initial encounter: Secondary | ICD-10-CM | POA: Diagnosis present

## 2021-07-15 DIAGNOSIS — Z8041 Family history of malignant neoplasm of ovary: Secondary | ICD-10-CM | POA: Diagnosis not present

## 2021-07-15 DIAGNOSIS — Z79899 Other long term (current) drug therapy: Secondary | ICD-10-CM | POA: Diagnosis not present

## 2021-07-15 DIAGNOSIS — L03012 Cellulitis of left finger: Secondary | ICD-10-CM | POA: Diagnosis present

## 2021-07-15 DIAGNOSIS — F32A Depression, unspecified: Secondary | ICD-10-CM

## 2021-07-15 DIAGNOSIS — G4730 Sleep apnea, unspecified: Secondary | ICD-10-CM | POA: Diagnosis present

## 2021-07-15 DIAGNOSIS — M65142 Other infective (teno)synovitis, left hand: Secondary | ICD-10-CM | POA: Diagnosis present

## 2021-07-15 DIAGNOSIS — Z885 Allergy status to narcotic agent status: Secondary | ICD-10-CM | POA: Diagnosis not present

## 2021-07-15 DIAGNOSIS — W540XXA Bitten by dog, initial encounter: Secondary | ICD-10-CM | POA: Diagnosis not present

## 2021-07-15 DIAGNOSIS — Z6836 Body mass index (BMI) 36.0-36.9, adult: Secondary | ICD-10-CM | POA: Diagnosis not present

## 2021-07-15 DIAGNOSIS — F988 Other specified behavioral and emotional disorders with onset usually occurring in childhood and adolescence: Secondary | ICD-10-CM | POA: Diagnosis not present

## 2021-07-15 DIAGNOSIS — L02512 Cutaneous abscess of left hand: Secondary | ICD-10-CM | POA: Diagnosis not present

## 2021-07-15 DIAGNOSIS — R739 Hyperglycemia, unspecified: Secondary | ICD-10-CM | POA: Diagnosis not present

## 2021-07-15 DIAGNOSIS — E669 Obesity, unspecified: Secondary | ICD-10-CM | POA: Diagnosis present

## 2021-07-15 HISTORY — PX: I & D EXTREMITY: SHX5045

## 2021-07-15 LAB — ABO/RH: ABO/RH(D): A POS

## 2021-07-15 LAB — C-REACTIVE PROTEIN: CRP: 5.2 mg/dL — ABNORMAL HIGH (ref <1.0)

## 2021-07-15 LAB — TYPE AND SCREEN
ABO/RH(D): A POS
Antibody Screen: NEGATIVE

## 2021-07-15 LAB — LACTIC ACID, PLASMA: Lactic Acid, Venous: 1.2 mmol/L (ref 0.5–1.9)

## 2021-07-15 LAB — SEDIMENTATION RATE: Sed Rate: 20 mm/hr (ref 0–22)

## 2021-07-15 LAB — HIV ANTIBODY (ROUTINE TESTING W REFLEX): HIV Screen 4th Generation wRfx: NONREACTIVE

## 2021-07-15 SURGERY — IRRIGATION AND DEBRIDEMENT EXTREMITY
Anesthesia: General | Site: Index Finger | Laterality: Left

## 2021-07-15 MED ORDER — KETOROLAC TROMETHAMINE 30 MG/ML IJ SOLN
INTRAMUSCULAR | Status: DC | PRN
  Administered 2021-07-15: 30 mg via INTRAVENOUS

## 2021-07-15 MED ORDER — FENTANYL CITRATE (PF) 100 MCG/2ML IJ SOLN
INTRAMUSCULAR | Status: AC
  Filled 2021-07-15: qty 2

## 2021-07-15 MED ORDER — BUPIVACAINE HCL (PF) 0.25 % IJ SOLN
INTRAMUSCULAR | Status: AC
  Filled 2021-07-15: qty 30

## 2021-07-15 MED ORDER — ONDANSETRON HCL 4 MG/2ML IJ SOLN
4.0000 mg | Freq: Four times a day (QID) | INTRAMUSCULAR | Status: DC | PRN
  Administered 2021-07-15: 4 mg via INTRAVENOUS
  Filled 2021-07-15: qty 2

## 2021-07-15 MED ORDER — ACETAMINOPHEN 500 MG PO TABS
ORAL_TABLET | ORAL | Status: AC
  Filled 2021-07-15: qty 2

## 2021-07-15 MED ORDER — LIDOCAINE 2% (20 MG/ML) 5 ML SYRINGE
INTRAMUSCULAR | Status: DC | PRN
  Administered 2021-07-15: 60 mg via INTRAVENOUS

## 2021-07-15 MED ORDER — PROPOFOL 10 MG/ML IV BOLUS
INTRAVENOUS | Status: DC | PRN
  Administered 2021-07-15: 140 mg via INTRAVENOUS

## 2021-07-15 MED ORDER — ONDANSETRON HCL 4 MG/2ML IJ SOLN
INTRAMUSCULAR | Status: DC | PRN
  Administered 2021-07-15: 4 mg via INTRAVENOUS

## 2021-07-15 MED ORDER — ONDANSETRON HCL 4 MG/2ML IJ SOLN
INTRAMUSCULAR | Status: AC
  Filled 2021-07-15: qty 2

## 2021-07-15 MED ORDER — LACTATED RINGERS IV SOLN: INTRAVENOUS | Status: DC

## 2021-07-15 MED ORDER — 0.9 % SODIUM CHLORIDE (POUR BTL) OPTIME
TOPICAL | Status: DC | PRN
  Administered 2021-07-15: 1000 mL

## 2021-07-15 MED ORDER — CHLORHEXIDINE GLUCONATE 0.12 % MT SOLN
OROMUCOSAL | Status: AC
  Administered 2021-07-15: 15 mL via OROMUCOSAL
  Filled 2021-07-15: qty 15

## 2021-07-15 MED ORDER — FENTANYL CITRATE (PF) 250 MCG/5ML IJ SOLN
INTRAMUSCULAR | Status: AC
  Filled 2021-07-15: qty 5

## 2021-07-15 MED ORDER — DULOXETINE HCL 60 MG PO CPEP
90.0000 mg | ORAL_CAPSULE | Freq: Every day | ORAL | Status: DC
  Administered 2021-07-16 – 2021-07-17 (×2): 90 mg via ORAL
  Filled 2021-07-15 (×2): qty 1

## 2021-07-15 MED ORDER — SODIUM CHLORIDE 0.9 % IV SOLN
3.0000 g | Freq: Four times a day (QID) | INTRAVENOUS | Status: DC
  Administered 2021-07-15 – 2021-07-17 (×9): 3 g via INTRAVENOUS
  Filled 2021-07-15 (×10): qty 8

## 2021-07-15 MED ORDER — DEXTROAMPHETAMINE SULFATE 5 MG PO TABS: 10.0000 mg | ORAL_TABLET | Freq: Every day | ORAL | Status: DC

## 2021-07-15 MED ORDER — DEXAMETHASONE SODIUM PHOSPHATE 10 MG/ML IJ SOLN
INTRAMUSCULAR | Status: DC | PRN
  Administered 2021-07-15: 10 mg via INTRAVENOUS

## 2021-07-15 MED ORDER — LACTATED RINGERS IV SOLN: INTRAVENOUS | Status: AC

## 2021-07-15 MED ORDER — PROPOFOL 10 MG/ML IV BOLUS
INTRAVENOUS | Status: AC
  Filled 2021-07-15: qty 20

## 2021-07-15 MED ORDER — ACETAMINOPHEN 650 MG RE SUPP: 650.0000 mg | Freq: Four times a day (QID) | RECTAL | Status: DC | PRN

## 2021-07-15 MED ORDER — SODIUM CHLORIDE 0.9 % IV SOLN: 12.5000 mg | Freq: Three times a day (TID) | INTRAVENOUS | Status: DC | PRN

## 2021-07-15 MED ORDER — HYDROMORPHONE HCL 1 MG/ML IJ SOLN
1.0000 mg | Freq: Once | INTRAMUSCULAR | Status: AC
  Administered 2021-07-15: 1 mg via INTRAVENOUS
  Filled 2021-07-15: qty 1

## 2021-07-15 MED ORDER — SODIUM CHLORIDE 0.9 % IV SOLN
1.5000 g | Freq: Once | INTRAVENOUS | Status: DC
  Filled 2021-07-15: qty 4

## 2021-07-15 MED ORDER — CHLORHEXIDINE GLUCONATE 0.12 % MT SOLN: 15.0000 mL | Freq: Once | OROMUCOSAL | Status: AC

## 2021-07-15 MED ORDER — MIDAZOLAM HCL 2 MG/2ML IJ SOLN
INTRAMUSCULAR | Status: AC
  Filled 2021-07-15: qty 2

## 2021-07-15 MED ORDER — FENTANYL CITRATE (PF) 250 MCG/5ML IJ SOLN
INTRAMUSCULAR | Status: DC | PRN
Start: 2021-07-15 — End: 2021-07-15
  Administered 2021-07-15 (×2): 25 ug via INTRAVENOUS

## 2021-07-15 MED ORDER — HYDROMORPHONE HCL 1 MG/ML IJ SOLN: 1.0000 mg | INTRAMUSCULAR | Status: DC | PRN

## 2021-07-15 MED ORDER — HYDROMORPHONE HCL 1 MG/ML IJ SOLN
0.5000 mg | INTRAMUSCULAR | Status: AC | PRN
  Administered 2021-07-15: 0.5 mg via INTRAVENOUS
  Filled 2021-07-15: qty 1

## 2021-07-15 MED ORDER — ACETAMINOPHEN 500 MG PO TABS
1000.0000 mg | ORAL_TABLET | Freq: Once | ORAL | Status: AC
  Administered 2021-07-15: 1000 mg via ORAL

## 2021-07-15 MED ORDER — FENTANYL CITRATE (PF) 100 MCG/2ML IJ SOLN
25.0000 ug | INTRAMUSCULAR | Status: DC | PRN
  Administered 2021-07-15: 50 ug via INTRAVENOUS

## 2021-07-15 MED ORDER — ACETAMINOPHEN 325 MG PO TABS: 650.0000 mg | ORAL_TABLET | Freq: Four times a day (QID) | ORAL | Status: DC | PRN

## 2021-07-15 MED ORDER — ORAL CARE MOUTH RINSE: 15.0000 mL | Freq: Once | OROMUCOSAL | Status: AC

## 2021-07-15 MED ORDER — DEXAMETHASONE SODIUM PHOSPHATE 10 MG/ML IJ SOLN
INTRAMUSCULAR | Status: AC
  Filled 2021-07-15: qty 1

## 2021-07-15 MED ORDER — BUPIVACAINE-EPINEPHRINE (PF) 0.25% -1:200000 IJ SOLN
INTRAMUSCULAR | Status: AC
  Filled 2021-07-15: qty 30

## 2021-07-15 MED ORDER — SODIUM CHLORIDE 0.9 % IV BOLUS
1000.0000 mL | Freq: Once | INTRAVENOUS | Status: AC
  Administered 2021-07-15: 1000 mL via INTRAVENOUS

## 2021-07-15 MED ORDER — ONDANSETRON HCL 4 MG PO TABS: 4.0000 mg | ORAL_TABLET | Freq: Four times a day (QID) | ORAL | Status: DC | PRN

## 2021-07-15 MED ORDER — LIDOCAINE 2% (20 MG/ML) 5 ML SYRINGE
INTRAMUSCULAR | Status: AC
  Filled 2021-07-15: qty 5

## 2021-07-15 MED ORDER — OXYCODONE HCL 5 MG PO TABS
5.0000 mg | ORAL_TABLET | Freq: Four times a day (QID) | ORAL | Status: DC | PRN
  Administered 2021-07-16: 5 mg via ORAL
  Filled 2021-07-15: qty 1

## 2021-07-15 MED ORDER — BUPIVACAINE HCL (PF) 0.25 % IJ SOLN
INTRAMUSCULAR | Status: DC | PRN
  Administered 2021-07-15: 13 mL

## 2021-07-15 MED ORDER — ACETAMINOPHEN 325 MG PO TABS
650.0000 mg | ORAL_TABLET | Freq: Four times a day (QID) | ORAL | Status: DC
  Administered 2021-07-15 – 2021-07-17 (×7): 650 mg via ORAL
  Filled 2021-07-15 (×7): qty 2

## 2021-07-15 MED ORDER — ONDANSETRON HCL 4 MG/2ML IJ SOLN: 4.0000 mg | Freq: Once | INTRAMUSCULAR | Status: DC | PRN

## 2021-07-15 SURGICAL SUPPLY — 40 items
BAG COUNTER SPONGE SURGICOUNT (BAG) ×2 IMPLANT
BNDG COHESIVE 1X5 TAN STRL LF (GAUZE/BANDAGES/DRESSINGS) ×1 IMPLANT
BNDG ELASTIC 3X5.8 VLCR STR LF (GAUZE/BANDAGES/DRESSINGS) IMPLANT
BNDG ELASTIC 4X5.8 VLCR STR LF (GAUZE/BANDAGES/DRESSINGS) IMPLANT
BNDG GAUZE ELAST 4 BULKY (GAUZE/BANDAGES/DRESSINGS) ×1 IMPLANT
CORD BIPOLAR FORCEPS 12FT (ELECTRODE) IMPLANT
CUFF TOURN SGL QUICK 18X4 (TOURNIQUET CUFF) IMPLANT
DRAPE SURG 17X23 STRL (DRAPES) ×2 IMPLANT
ELECT REM PT RETURN 9FT ADLT (ELECTROSURGICAL)
ELECTRODE REM PT RTRN 9FT ADLT (ELECTROSURGICAL) IMPLANT
GAUZE PACKING IODOFORM 1/4X15 (PACKING) IMPLANT
GAUZE SPONGE 4X4 12PLY STRL (GAUZE/BANDAGES/DRESSINGS) ×2 IMPLANT
GAUZE XEROFORM 1X8 LF (GAUZE/BANDAGES/DRESSINGS) ×2 IMPLANT
GLOVE BIOGEL M 8.0 STRL (GLOVE) ×2 IMPLANT
GOWN STRL REUS W/ TWL LRG LVL3 (GOWN DISPOSABLE) ×2 IMPLANT
GOWN STRL REUS W/TWL LRG LVL3 (GOWN DISPOSABLE) ×2
HANDPIECE INTERPULSE COAX TIP (DISPOSABLE)
IV NS IRRIG 3000ML ARTHROMATIC (IV SOLUTION) IMPLANT
KIT BASIN OR (CUSTOM PROCEDURE TRAY) ×2 IMPLANT
KIT TURNOVER KIT B (KITS) ×2 IMPLANT
MANIFOLD NEPTUNE II (INSTRUMENTS) ×1 IMPLANT
NDL HYPO 25GX1X1/2 BEV (NEEDLE) IMPLANT
NEEDLE HYPO 25GX1X1/2 BEV (NEEDLE) ×2 IMPLANT
NS IRRIG 1000ML POUR BTL (IV SOLUTION) ×2 IMPLANT
PACK ORTHO EXTREMITY (CUSTOM PROCEDURE TRAY) ×2 IMPLANT
PAD ARMBOARD 7.5X6 YLW CONV (MISCELLANEOUS) ×4 IMPLANT
PAD CAST 4YDX4 CTTN HI CHSV (CAST SUPPLIES) IMPLANT
PADDING CAST COTTON 4X4 STRL (CAST SUPPLIES)
SET CYSTO W/LG BORE CLAMP LF (SET/KITS/TRAYS/PACK) IMPLANT
SET HNDPC FAN SPRY TIP SCT (DISPOSABLE) IMPLANT
SOAP 2 % CHG 4 OZ (WOUND CARE) ×2 IMPLANT
SPONGE T-LAP 18X18 ~~LOC~~+RFID (SPONGE) IMPLANT
SPONGE T-LAP 4X18 ~~LOC~~+RFID (SPONGE) ×2 IMPLANT
SUT ETHILON 5 0 CL P 3 (SUTURE) ×1 IMPLANT
SYR CONTROL 10ML LL (SYRINGE) IMPLANT
TOWEL GREEN STERILE (TOWEL DISPOSABLE) ×2 IMPLANT
TOWEL GREEN STERILE FF (TOWEL DISPOSABLE) ×2 IMPLANT
TUBE CONNECTING 12X1/4 (SUCTIONS) ×2 IMPLANT
WATER STERILE IRR 1000ML POUR (IV SOLUTION) ×2 IMPLANT
YANKAUER SUCT BULB TIP NO VENT (SUCTIONS) ×2 IMPLANT
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
## 2021-07-15 NOTE — ED Notes (Signed)
Pt ambulatory to bathroom w/ steady gait

## 2021-07-15 NOTE — Progress Notes (Signed)
?                                  PROGRESS NOTE                                             ?                                                                                                                     ?                                         ? ? Patient Demographics:  ? ? Becky Lutz, is a 56 y.o. female, DOB - [birth year], IRS:[id number] ? ?Outpatient Primary MD for the patient is Becky Sia, MD    LOS - 0  Admit date - 07/14/2021   ? ?Chief Complaint  ?Patient presents with  ? Animal Bite  ?    ? ?Brief Narrative (HPI from H&P)    56 year old female with past medical history of depression and attention deficit disorder presents to the hospital after she was bitten her left index finger and hand by her pet dog, the dog was fighting another pet dog when this happened and it was accidental.  Apparently the dog was vaccinated for rabies but this vaccination expired few weeks ago.  She came to the ER hand surgery was consulted and she was admitted for left index finger and hand infection. ? ? Subjective:  ? ? Becky Lutz today has, No headache, No chest pain, No abdominal pain - No Nausea, No new weakness tingling or numbness, no SOB, some discomfort in the left index finger and hand. ? ? Assessment  & Plan :  ? ?Post dog bite infectious tenosynovitis of left index finger and some hand infection along with reactive infectious lymphadenopathy in the left axilla. She has been placed on Unasyn, hand surgery has been consulted and she is scheduled for incision and drainage later today, will monitor perioperative cultures, blood cultures, ID has also been consulted to address any possibility of exposure to rabies which seems unlikely.  Dog was previously vaccinated for rabies but the expiration apparently expired few weeks prior to this event.  Continue supportive care. ? ? ? ? ? ? ? ? ? ?Depression ?On Cymbalta at home. ? ?Attention deficit disorder (ADD) in  adult ?On Adderall at home. ? ? ? ?   ? ?Condition - Fair ? ?Family Communication  :  none present ? ?Code Status :  Full ? ?Consults  :  Hand Surgery Dr Becky Lutz ? ?PUD Prophylaxis :  ? ? Procedures  :    ? ?  ? ?   ? ?  Disposition Plan  :   ? ?Status is: Observation ? ?DVT Prophylaxis  :   ? ?SCDs Start: 07/15/21 0511 ? ? ? ?Lab Results  ?Component Value Date  ? PLT 222 07/14/2021  ? ? ?Diet :  ?Diet Order   ? ?       ?  Diet NPO time specified  Diet effective now       ?  ? ?  ?  ? ?  ?  ? ?Inpatient Medications ? ?Scheduled Meds: ? [START ON 07/16/2021] DULoxetine  90 mg Oral Daily  ? ?Continuous Infusions: ? ampicillin-sulbactam (UNASYN) IV Stopped (07/15/21 0550)  ? lactated ringers 75 mL/hr at 07/15/21 0549  ? ?PRN Meds:.acetaminophen **OR** acetaminophen, HYDROmorphone (DILAUDID) injection, ondansetron **OR** ondansetron (ZOFRAN) IV ? ?Antibiotics  :   ? ?Anti-infectives (From admission, onward)  ? ? Start     Dose/Rate Route Frequency Ordered Stop  ? 07/15/21 0500  Ampicillin-Sulbactam (UNASYN) 3 g in sodium chloride 0.9 % 100 mL IVPB       ? 3 g ?200 mL/hr over 30 Minutes Intravenous Every 6 hours 07/15/21 0455    ? 07/15/21 0415  ampicillin-sulbactam (UNASYN) 1.5 g in sodium chloride 0.9 % 100 mL IVPB  Status:  Discontinued       ? 1.5 g ?200 mL/hr over 30 Minutes Intravenous  Once 07/15/21 0405 07/15/21 0455  ? ?  ? ? ? Time Spent in minutes  30 ? ? ?Becky Lutz M.D on 07/15/2021 at 10:06 AM ? ?To page go to [email or web address]  ? ?[facility] -  Office  [phone] ? ?See all Orders from today for further details ? ? ? Objective:  ? ?Vitals:  ? 07/15/21 0730 07/15/21 0745 07/15/21 0800 07/15/21 0830  ?BP: 105/69 110/64 96/60 128/73  ?Pulse: 80 90 78 90  ?Resp:    18  ?Temp:    98.4 ?F (36.9 ?C)  ?TempSrc:    Oral  ?SpO2: 94% 97% 98% 100%  ?Weight:      ?Height:      ? ? ?Wt Readings from Last 3 Encounters:  ?07/14/21 103.9 kg  ?07/12/21 103.9 kg  ?11/12/17 107.5 kg  ? ? ? ?Intake/Output Summary (Last 24  hours) at 07/15/2021 1006 ?Last data filed at 07/15/2021 0705 ?Gross per 24 hour  ?Intake 1101.12 ml  ?Output --  ?Net 1101.12 ml  ? ? ? ?Physical Exam ? ?Awake Alert, No new F.N deficits, Normal affect ?[place].AT,PERRAL ?Supple Neck, No JVD,   ?Symmetrical Chest wall movement, Good air movement bilaterally, CTAB ?RRR,No Gallops,Rubs or new Murmurs,  ?+ve B.Sounds, Abd Soft, No tenderness,   ?No Cyanosis, left index finger appears infected with a puncture wound, tracking up to her left hand and wrist, palpable left axillary lymph nodes which are nontender ? ? Data Review:  ? ? ?CBC ?Recent Labs  ?Lab 07/14/21 ?2304  ?WBC 8.7  ?HGB 14.2  ?HCT 41.5  ?PLT 222  ?MCV 91.2  ?MCH 31.2  ?MCHC 34.2  ?RDW 12.3  ?LYMPHSABS 2.4  ?MONOABS 0.7  ?EOSABS 0.2  ?BASOSABS 0.0  ? ? ?Electrolytes ?Recent Labs  ?Lab 07/14/21 ?2304 07/15/21 ?0440  ?NA 137  --   ?K 4.0  --   ?CL 107  --   ?CO2 22  --   ?GLUCOSE 115*  --   ?BUN 21*  --   ?CREATININE 1.02*  --   ?CALCIUM 9.0  --   ?AST 29  --   ?ALT 26  --   ?  ALKPHOS 61  --   ?BILITOT 0.6  --   ?ALBUMIN 3.8  --   ?LATICACIDVEN 0.8 1.2  ? ? ?------------------------------------------------------------------------------------------------------------------ ?No results for input(s): CHOL, HDL, LDLCALC, TRIG, CHOLHDL, LDLDIRECT in the last 72 hours. ? ?No results found for: HGBA1C ? ?No results for input(s): TSH, T4TOTAL, T3FREE, THYROIDAB in the last 72 hours. ? ?Invalid input(s): FREET3 ?------------------------------------------------------------------------------------------------------------------ ?ID Labs ?Recent Labs  ?Lab 07/14/21 ?2304 07/15/21 ?0440  ?WBC 8.7  --   ?PLT 222  --   ?LATICACIDVEN 0.8 1.2  ?CREATININE 1.02*  --   ? ?Cardiac Enzymes ?No results for input(s): CKMB, TROPONINI, MYOGLOBIN in the last 168 hours. ? ?Invalid input(s): CK ? ? ?  ? ? ?Micro Results ?Recent Results (from the past 240 hour(s))  ?Blood culture (routine x 2)     Status: None (Preliminary result)  ? Collection  Time: 07/14/21 11:04 PM  ? Specimen: BLOOD RIGHT HAND  ?Result Value Ref Range Status  ? Specimen Description BLOOD RIGHT HAND  Final  ? Special Requests   Final  ?  BOTTLES DRAWN AEROBIC AND ANAEROBIC Blood Culture adequate volume  ? Culture   Final  ?  NO GROWTH < 12 HOURS ?Performed at [facility], [place] [address], [place], [place] [address] ?  ? Report Status PENDING  Incomplete  ?Blood culture (routine x 2)     Status: None (Preliminary result)  ? Collection Time: 07/15/21  4:40 AM  ? Specimen: BLOOD  ?Result Value Ref Range Status  ? Specimen Description BLOOD SITE NOT SPECIFIED  Final  ? Special Requests   Final  ?  BOTTLES DRAWN AEROBIC AND ANAEROBIC Blood Culture adequate volume  ? Culture   Final  ?  NO GROWTH < 12 HOURS ?Performed at [facility], [place] [address], [place], [place] [address] ?  ? Report Status PENDING  Incomplete  ? ? ?Radiology Reports ?DG Hand Complete Left ? ?Result Date: 07/12/2021 ?CLINICAL DATA:  Status post dog bite. EXAM: LEFT HAND - COMPLETE 3+ VIEW COMPARISON:  None Available. FINDINGS: There is no evidence of fracture or dislocation. There is no evidence of arthropathy or other focal bone abnormality. Soft tissues are unremarkable. IMPRESSION: Negative. Electronically Signed   By: Virgina Norfolk M.D.   On: 07/12/2021 22:04    ? ? ?

## 2021-07-15 NOTE — Anesthesia Preprocedure Evaluation (Signed)
Anesthesia Evaluation  ?Patient identified by MRN, date of birth, ID band ?Patient awake ? ? ? ?Reviewed: ?Allergy & Precautions, NPO status , Patient's Chart, lab work & pertinent test results ? ?Airway ?Mallampati: II ? ?TM Distance: >3 FB ?Neck ROM: Full ? ? ? Dental ? ?(+) Teeth Intact, Dental Advisory Given ?  ?Pulmonary ?sleep apnea and Continuous Positive Airway Pressure Ventilation ,  ?  ?Pulmonary exam normal ?breath sounds clear to auscultation ? ? ? ? ? ? Cardiovascular ?negative cardio ROS ?Normal cardiovascular exam ?Rhythm:Regular Rate:Normal ? ? ?  ?Neuro/Psych ? Headaches, PSYCHIATRIC DISORDERS Anxiety Depression   ? GI/Hepatic ?negative GI ROS, Neg liver ROS,   ?Endo/Other  ?Obesity ? ? Renal/GU ?negative Renal ROS  ? ?  ?Musculoskeletal ?negative musculoskeletal ROS ?(+)  ? Abdominal ?  ?Peds ? ?(+) ATTENTION DEFICIT DISORDER WITHOUT HYPERACTIVITY Hematology ?negative hematology ROS ?(+)   ?Anesthesia Other Findings ?Day of surgery medications reviewed with the patient. ? Reproductive/Obstetrics ? ?  ? ? ? ? ? ? ? ? ? ? ? ? ? ?  ?  ? ? ? ? ? ? ? ? ?Anesthesia Physical ?Anesthesia Plan ? ?ASA: 2 ? ?Anesthesia Plan: General  ? ?Post-op Pain Management: Tylenol PO (pre-op)*  ? ?Induction: Intravenous ? ?PONV Risk Score and Plan: 3 and Midazolam, Dexamethasone and Ondansetron ? ?Airway Management Planned: LMA ? ?Additional Equipment:  ? ?Intra-op Plan:  ? ?Post-operative Plan: Extubation in OR ? ?Informed Consent: I have reviewed the patients History and Physical, chart, labs and discussed the procedure including the risks, benefits and alternatives for the proposed anesthesia with the patient or authorized representative who has indicated his/her understanding and acceptance.  ? ? ? ?Dental advisory given ? ?Plan Discussed with: CRNA ? ?Anesthesia Plan Comments:   ? ? ? ? ? ? ?Anesthesia Quick Evaluation ? ?

## 2021-07-15 NOTE — Progress Notes (Signed)
PT Cancellation Note ? ?Patient Details ?Name: Becky Lutz ?MRN: [id number] ?DOB: 11-04-1965 ? ? ?Cancelled Treatment:    Reason Eval/Treat Not Completed: PT screened, no needs identified, will sign off Per OT, pt with no skilled PT needs. If needs change, please re-consult.  ? ?Reuel Derby, PT, DPT  ?Acute Rehabilitation Services  ?Pager: [phone] ?Office: [phone] ? ?[place] ?07/15/2021, 11:52 AM ?

## 2021-07-15 NOTE — Progress Notes (Signed)
Pharmacy Antibiotic Note ? ?Becky Lutz is a 56 y.o. female admitted on 07/14/2021 with  dog-bite wound infection awaiting I&D .  Pharmacy has been consulted for Unasyn dosing.  Of note cellulitis worsened despite Augmentin treatment as outpt though pt had had only 2 doses PTA. ? ?Plan: ?Unasyn 3g IV Q6H. ? ?Height: '5\' 6"'$  (167.6 cm) ?Weight: 103.9 kg (229 lb) ?IBW/kg (Calculated) : 59.3 ? ?Temp (24hrs), Avg:98.5 ?F (36.9 ?C), Min:98.5 ?F (36.9 ?C), Max:98.5 ?F (36.9 ?C) ? ?Recent Labs  ?Lab 07/14/21 ?2304  ?WBC 8.7  ?CREATININE 1.02*  ?LATICACIDVEN 0.8  ?  ?Estimated Creatinine Clearance: 74.1 mL/min (A) (by C-G formula based on SCr of 1.02 mg/dL (H)).   ? ?Allergies  ?Allergen Reactions  ? Codeine Other (See Comments)  ?  Vomiting  ? ? ? ?Thank you for allowing pharmacy to be a part of this patient?s care. ? ?Wynona Neat, PharmD, BCPS  ?07/15/2021 5:02 AM ? ?

## 2021-07-15 NOTE — ED Provider Notes (Signed)
?[facility] ?Provider Note ? ?CSN: [id number] ?Arrival date & time: 07/14/21 2051 ? ?Chief Complaint(s) ?Animal Bite ? ?HPI ?Becky Lutz is a 56 y.o. female here for worsening left index finger pain and swelling following a dog bite several days ago.  Patient was initially seen at outside emergency department, given shot of tetanus and placed on Augmentin.  Patient developed worsening pain, swelling, and redness that was spreading from the finger to the hand.  Patient reports that she been taking her Augmentin and has had 2 doses.  Pain is worse with movement and palpation.  She is unable to flex or extend at the DIP, PIP or MCP joint. ? ?The history is provided by the patient.  ? ?Past Medical History ?Past Medical History:  ?Diagnosis Date  ? Anemia   ? AT AGE 75 AFTER GIVING BLOOD  ? Anxiety   ? Complication of anesthesia   ? WAKES UP FIGHTING  ? Family history of adverse reaction to anesthesia   ? NEPHEW WAKES UP FIGHTING DUE TO VERSED  ? Headache   ? Pre-diabetes   ? Sleep apnea   ? CPAP  ? ?Patient Active Problem List  ? Diagnosis Date Noted  ? Rectal bleeding   ? ?Home Medication(s) ?Prior to Admission medications   ?Medication Sig Start Date End Date Taking? Authorizing Provider  ?acetaminophen (TYLENOL) 500 MG tablet Take 1,000 mg by mouth every 6 (six) hours as needed.    [provider]  ?amoxicillin-clavulanate (AUGMENTIN) 875-125 MG tablet Take 1 tablet by mouth 2 (two) times daily for 10 days. 07/12/21 07/22/21  Poggi, Clarnce Flock, PA-C  ?Aspirin-Acetaminophen-Caffeine (EXCEDRIN MIGRAINE PO) Take 1-2 tablets by mouth as needed.    [provider]  ?dextroamphetamine (DEXTROSTAT) 5 MG tablet Take 5 mg by mouth See admin instructions. Pt takes daily, and may take additional dose if needed for sleepiness 06/11/17   [provider]  ?DULoxetine (CYMBALTA) 60 MG capsule Take 60 mg by mouth every morning.  03/05/17   [provider]   ?ketorolac (TORADOL) 10 MG tablet Take 10 mg by mouth every 8 (eight) hours as needed (migraines).  04/08/17   [provider]  ?Multiple Vitamins-Minerals (MULTIVITAMIN WITH MINERALS) tablet Take 1 tablet by mouth daily.    [provider]  ?naproxen sodium (ALEVE) 220 MG tablet Take 220 mg by mouth 2 (two) times daily as needed (migraines).     [provider]  ?promethazine (PHENERGAN) 12.5 MG tablet Take 1 tablet (12.5 mg total) by mouth every 6 (six) hours as needed for nausea. 10/22/17   [name], DPM  ?tapentadol (NUCYNTA) 50 MG tablet Take 1 tablet (50 mg total) by mouth every 4 (four) hours as needed for up to 20 doses. 10/22/17   [name], DPM  ?vitamin B-12 (CYANOCOBALAMIN) 1000 MCG tablet Take 1,000 mcg by mouth daily.    [provider]  ?vitamin C (ASCORBIC ACID) 500 MG tablet Take 500 mg by mouth daily.    [provider]  ?                                                                                                                                  ?  Allergies ?Codeine ? ?Review of Systems ?Review of Systems ?As noted in HPI ? ?Physical Exam ?Vital Signs  ?I have reviewed the triage vital signs ?BP 117/71   Pulse 80   Temp 98.5 ?F (36.9 ?C) (Oral)   Resp 16   Ht '5\' 6"'$  (1.676 m)   Wt 103.9 kg   SpO2 99%   BMI 36.96 kg/m?  ? ?Physical Exam ?Vitals reviewed.  ?Constitutional:   ?   General: She is not in acute distress. ?   Appearance: She is well-developed. She is not diaphoretic.  ?HENT:  ?   Head: Normocephalic and atraumatic.  ?   Right Ear: External ear normal.  ?   Left Ear: External ear normal.  ?   Nose: Nose normal.  ?Eyes:  ?   General: No scleral icterus. ?   Conjunctiva/sclera: Conjunctivae normal.  ?Neck:  ?   Trachea: Phonation normal.  ?Cardiovascular:  ?   Rate and Rhythm: Normal rate and regular rhythm.  ?Pulmonary:  ?   Effort: Pulmonary effort is normal. No respiratory distress.  ?   Breath sounds: No stridor.  ?Abdominal:   ?   General: There is no distension.  ?Musculoskeletal:  ?   Left hand: Swelling, laceration and tenderness present. Decreased range of motion.  ?     Hands: ? ?   Cervical back: Normal range of motion.  ?Neurological:  ?   Mental Status: She is alert and oriented to person, place, and time.  ?Psychiatric:     ?   Behavior: Behavior normal.  ? ? ? ? ? ? ? ?ED Results and Treatments ?Labs ?(all labs ordered are listed, but only abnormal results are displayed) ?Labs Reviewed  ?COMPREHENSIVE METABOLIC PANEL - Abnormal; Notable for the following components:  ?    Result Value  ? Glucose, Bld 115 (*)   ? BUN 21 (*)   ? Creatinine, Ser 1.02 (*)   ? All other components within normal limits  ?CULTURE, BLOOD (ROUTINE X 2)  ?CULTURE, BLOOD (ROUTINE X 2)  ?CBC WITH DIFFERENTIAL/PLATELET  ?LACTIC ACID, PLASMA  ?LACTIC ACID, PLASMA  ?                                                                                                                       ?EKG ? EKG Interpretation ? ?Date/Time:    ?Ventricular Rate:    ?PR Interval:    ?QRS Duration:   ?QT Interval:    ?QTC Calculation:   ?R Axis:     ?Text Interpretation:   ?  ? ?  ? ?Radiology ?No results found. ? ?Pertinent labs & imaging results that were available during my care of the patient were reviewed by me and considered in my medical decision making (see MDM for details). ? ?Medications Ordered in ED ?Medications  ?ampicillin-sulbactam (UNASYN) 1.5 g in sodium chloride 0.9 % 100 mL IVPB (has no administration in time range)  ?HYDROmorphone (DILAUDID) injection 1 mg (has no administration  in time range)  ?sodium chloride 0.9 % bolus 1,000 mL (has no administration in time range)  ?                                                               ?                                                                    ?Procedures ?Ultrasound ED Soft Tissue ? ?Date/Time: 07/15/2021 4:29 AM ?Performed by: Fatima Blank, MD ?Authorized by: Fatima Blank, MD   ? ?Procedure details:  ?  Indications: limb pain and localization of abscess   ?  Transverse view:  Visualized ?  Longitudinal view:  Visualized ?  Images: archived   ?Location:  ?  Location: upper extremity   ?  Side:  Left ?Findings:  ?   abscess present ?   cellulitis present ?Comments:  ?   Hypoechoic fluid along flexor tendon ? ? ? ? ?(including critical care time) ? ?Medical Decision Making / ED Course ? ? ? Complexity of Problem: ? ? ?Additional history obtained: ?Outside hospital ED note confirming the above ? ?Patient's presenting problem/concern, DDX, and MDM listed below: ?Left index finger pain and swelling ?Secondary to dog bite ?Presentation is concerning for worsening infection specifically flexor tenosynovitis ?Ultrasound obtained as above consistent with findings concerning for flexor tenosynovitis ? ?Hospitalization Considered:  ?Yes ? ?Initial Intervention:  ?IV pain medicine and antibiotics ? ?  Complexity of Data: ? ? Laboratory Tests ordered listed below with my independent interpretation: ?CBC without leukocytosis or anemia  ?No significant electrolyte derangement or renal sufficiency ?  ?Imaging Studies ordered listed below with my independent interpretation: ?Bedside ultrasound noted as above ?  ?  ?ED Course:   ? ?Assessment, Add'l Intervention, and Reassessment: ?Finger infection status post dog bite ?Work-up is concerning for flexor tenosynovitis ?Provided with IV pain medicine ?Started on IV Unasyn ?Consulted hand surgery spoke with Dr. Lenon Curt who requested medicine admit for IV antibiotics.  He plans to take the patient to the operating room later today for I&D. ?Consulted hospitalist service and spoke with Dr. Bridgett Larsson who agreed to admit patient ? ?Final Clinical Impression(s) / ED Diagnoses ?Final diagnoses:  ?Flexor tenosynovitis of finger  ? ? ?  ? ? ? ? ? ?This chart was dictated using voice recognition software.  Despite best efforts to proofread,  errors can occur which can change  the documentation meaning. ? ?  ?Fatima Blank, MD ?07/15/21 0430 ? ?

## 2021-07-15 NOTE — Subjective & Objective (Signed)
Chief complaint: Dog bite left hand ?History present illness: ?56 year old white female with no significant medical history except for depression and attention deficit disorder who was bit on her left hand by her own dog on Saturday.  She went to [facility].  She was prescribed Augmentin.  She got it filled on Sunday.  First dose was Sunday night.  Her left index finger has been increasingly swollen.  Increasing pain.  Increasing swelling.  Brought back to the ER today. ? ?Exam of her left index finger consistent with tenosynovitis. ? ?EDP is contacted hand surgery is planning on taking the patient to the OR later on today for washout.  Labs are reassuring. ? ?[facility] hospitalist contacted for admission. ?

## 2021-07-15 NOTE — Plan of Care (Signed)

## 2021-07-15 NOTE — Assessment & Plan Note (Addendum)
Observation med/surg bed.  EDP states they have contacted hand surgery.  Plan is for operative washout later today.  Keep NPO.  Dilaudid IV 1 mg every 2 hours as needed pain.  IV Unasyn.  Blood cultures were obtained.  IV fluids while she is NPO.

## 2021-07-15 NOTE — Transfer of Care (Signed)
Immediate Anesthesia Transfer of Care Note ? ?Patient: Becky Lutz ? ?Procedure(s) Performed: IRRIGATION AND DEBRIDEMENT LEFT INDEX FINGER (Left: Index Finger) ? ?Patient Location: PACU ? ?Anesthesia Type:General ? ?Level of Consciousness: awake and alert  ? ?Airway & Oxygen Therapy: Patient Spontanous Breathing ? ?Post-op Assessment: Report given to RN and Post -op Vital signs reviewed and stable ? ?Post vital signs: Reviewed and stable ? ?Last Vitals:  ?Vitals Value Taken Time  ?BP 131/85 07/15/21 1647  ?Temp    ?Pulse 104 07/15/21 1648  ?Resp 15 07/15/21 1648  ?SpO2 92 % 07/15/21 1648  ?Vitals shown include unvalidated device data. ? ?Last Pain:  ?Vitals:  ? 07/15/21 1546  ?TempSrc:   ?PainSc: 7   ?   ? ?  ? ?Complications: No notable events documented. ?

## 2021-07-15 NOTE — Assessment & Plan Note (Signed)
On Adderall at home. ?

## 2021-07-15 NOTE — Anesthesia Procedure Notes (Signed)
Procedure Name: LMA Insertion ?Date/Time: 07/15/2021 4:13 PM ?Performed by: Dorann Lodge, CRNA ?Pre-anesthesia Checklist: Patient identified, Emergency Drugs available, Suction available and Patient being monitored ?Patient Re-evaluated:Patient Re-evaluated prior to induction ?Oxygen Delivery Method: Circle System Utilized ?Preoxygenation: Pre-oxygenation with 100% oxygen ?Induction Type: IV induction ?Ventilation: Mask ventilation without difficulty ?LMA: LMA inserted ?LMA Size: 4.0 ?Number of attempts: 1 ?Airway Equipment and Method: Bite block ?Placement Confirmation: positive ETCO2 ?Tube secured with: Tape ?Dental Injury: Teeth and Oropharynx as per pre-operative assessment  ? ? ? ? ?

## 2021-07-15 NOTE — Consult Note (Signed)
Reason for Consult:finger infection ?Referring Physician: ER ? ?CC:My dog bit me  ?HPI:  Becky Lutz is an 56 y.o. right handed female who presents with   dog bite to left index finger  .  Pt ws seen initially at an outside facility, given Rx for Augmentin, finger became worse; presented to ER ?Pain is rated at    7/10 and is described as sharp.  Pain is constant.  Pain is made better by rest/immobilization, worse with motion.   ?Associated signs/symptoms: erythema to dorsal hand, difficulty flexion/extending finger ?Previous treatment:   ? ?Past Medical History:  ?Diagnosis Date  ? Anemia   ? AT AGE 37 AFTER GIVING BLOOD  ? Anxiety   ? Attention deficit disorder (ADD) in adult   ? Complication of anesthesia   ? WAKES UP FIGHTING  ? Depression   ? Family history of adverse reaction to anesthesia   ? NEPHEW WAKES UP FIGHTING DUE TO VERSED  ? Headache   ? Pre-diabetes   ? Sleep apnea   ? CPAP  ? ? ?Past Surgical History:  ?Procedure Laterality Date  ? ABDOMINAL HYSTERECTOMY    ? 2006  ? ACHILLES TENDON SURGERY Left 10/22/2017  ? Procedure: ACHILLES TENDON REPAIR-SECONDARY;  Surgeon: [name], DPM;  Location: [facility] ORS;  Service: Podiatry;  Laterality: Left;  ? arm surgery move nerve  Right   ? CESAREAN SECTION    ? CHOLECYSTECTOMY    ? 1996  ? COLONOSCOPY WITH PROPOFOL N/A 08/16/2017  ? Procedure: COLONOSCOPY WITH PROPOFOL;  Surgeon: Lin Landsman, MD;  Location: [facility] ENDOSCOPY;  Service: Gastroenterology;  Laterality: N/A;  ? HALLUX VALGUS LAPIDUS Left 10/22/2017  ? Procedure: HALLUX VALGUS LAPIDUS;  Surgeon: [name], DPM;  Location: [facility] ORS;  Service: Podiatry;  Laterality: Left;  ? OSTECTOMY Left 10/22/2017  ? Procedure: OSTECTOMY-HAGLUNDS/RECTROCALCANEAL;  Surgeon: [name], DPM;  Location: [facility] ORS;  Service: Podiatry;  Laterality: Left;  ? ? ?Family History  ?Problem Relation Age of Onset  ? Ovarian cancer Mother 30  ?     not sure ovarian or uterine   ? ? ?Social History:  reports  that she has never smoked. She has never used smokeless tobacco. She reports current alcohol use. She reports that she does not use drugs. ? ?Allergies:  ?Allergies  ?Allergen Reactions  ? Codeine Other (See Comments)  ?  Vomiting  ? ? ?Medications: I have reviewed the patient's current medications. ? ?Results for orders placed or performed during the hospital encounter of 07/14/21 (from the past 48 hour(s))  ?CBC with Differential     Status: None  ? Collection Time: 07/14/21 11:04 PM  ?Result Value Ref Range  ? WBC 8.7 4.0 - 10.5 K/uL  ? RBC 4.55 3.87 - 5.11 MIL/uL  ? Hemoglobin 14.2 12.0 - 15.0 g/dL  ? HCT 41.5 36.0 - 46.0 %  ? MCV 91.2 80.0 - 100.0 fL  ? MCH 31.2 26.0 - 34.0 pg  ? MCHC 34.2 30.0 - 36.0 g/dL  ? RDW 12.3 11.5 - 15.5 %  ? Platelets 222 150 - 400 K/uL  ? nRBC 0.0 0.0 - 0.2 %  ? Neutrophils Relative % 62 %  ? Neutro Abs 5.3 1.7 - 7.7 K/uL  ? Lymphocytes Relative 28 %  ? Lymphs Abs 2.4 0.7 - 4.0 K/uL  ? Monocytes Relative 8 %  ? Monocytes Absolute 0.7 0.1 - 1.0 K/uL  ? Eosinophils Relative 2 %  ? Eosinophils Absolute 0.2 0.0 -  0.5 K/uL  ? Basophils Relative 0 %  ? Basophils Absolute 0.0 0.0 - 0.1 K/uL  ? Immature Granulocytes 0 %  ? Abs Immature Granulocytes 0.02 0.00 - 0.07 K/uL  ?  Comment: Performed at [facility], [place] [address] [place], [place] [address]  ?Comprehensive metabolic panel     Status: Abnormal  ? Collection Time: 07/14/21 11:04 PM  ?Result Value Ref Range  ? Sodium 137 135 - 145 mmol/L  ? Potassium 4.0 3.5 - 5.1 mmol/L  ? Chloride 107 98 - 111 mmol/L  ? CO2 22 22 - 32 mmol/L  ? Glucose, Bld 115 (H) 70 - 99 mg/dL  ?  Comment: Glucose reference range applies only to samples taken after fasting for at least 8 hours.  ? BUN 21 (H) 6 - 20 mg/dL  ? Creatinine, Ser 1.02 (H) 0.44 - 1.00 mg/dL  ? Calcium 9.0 8.9 - 10.3 mg/dL  ? Total Protein 6.9 6.5 - 8.1 g/dL  ? Albumin 3.8 3.5 - 5.0 g/dL  ? AST 29 15 - 41 U/L  ? ALT 26 0 - 44 U/L  ? Alkaline Phosphatase 61 38 - 126 U/L  ? Total  Bilirubin 0.6 0.3 - 1.2 mg/dL  ? GFR, Estimated >60 >60 mL/min  ?  Comment: (NOTE) ?Calculated using the CKD-EPI Creatinine Equation (2021) ?  ? Anion gap 8 5 - 15  ?  Comment: Performed at [facility], [place] [address] [place], [place] [address]  ?Lactic acid, plasma     Status: None  ? Collection Time: 07/14/21 11:04 PM  ?Result Value Ref Range  ? Lactic Acid, Venous 0.8 0.5 - 1.9 mmol/L  ?  Comment: Performed at [facility], [place] [address], [place], [place] [address]  ?Blood culture (routine x 2)     Status: None (Preliminary result)  ? Collection Time: 07/14/21 11:04 PM  ? Specimen: BLOOD RIGHT HAND  ?Result Value Ref Range  ? Specimen Description BLOOD RIGHT HAND   ? Special Requests    ?  BOTTLES DRAWN AEROBIC AND ANAEROBIC Blood Culture adequate volume  ? Culture    ?  NO GROWTH < 12 HOURS ?Performed at [facility], [place] [address], [place], [place] [address] ?  ? Report Status PENDING   ?ABO/Rh     Status: None  ? Collection Time: 07/14/21 11:04 PM  ?Result Value Ref Range  ? ABO/RH(D)    ?  A POS ?Performed at [facility], [place] [address], [place], [place] [address] ?  ?Lactic acid, plasma     Status: None  ? Collection Time: 07/15/21  4:40 AM  ?Result Value Ref Range  ? Lactic Acid, Venous 1.2 0.5 - 1.9 mmol/L  ?  Comment: Performed at [facility], [place] [address] [place], [place] [address]  ?Blood culture (routine x 2)     Status: None (Preliminary result)  ? Collection Time: 07/15/21  4:40 AM  ? Specimen: BLOOD  ?Result Value Ref Range  ? Specimen Description BLOOD SITE NOT SPECIFIED   ? Special Requests    ?  BOTTLES DRAWN AEROBIC AND ANAEROBIC Blood Culture adequate volume  ? Culture    ?  NO GROWTH < 12 HOURS ?Performed at [facility], [place] [address] [place], [place] [address] ?  ? Report Status PENDING   ?Type and screen     Status: None  ? Collection Time: 07/15/21  6:32 AM  ?  Result Value Ref Range  ? ABO/RH(D) A POS   ? Antibody Screen NEG   ? Sample  Expiration    ?  07/18/2021,2359 ?Performed at [facility], [place] [address] [place], [place] [address] ?  ? ? ?No results found. ? ?Pertinent items are noted in HPI. ?Temp:  [98.4 ?F (36.9 ?C)-98.5 ?F (36.9 ?C)] 98.4 ?F (36.9 ?C) (05/16 0830) ?Pulse Rate:  [74-90] 84 (05/16 1500) ?Resp:  [16-19] 16 (05/16 1400) ?BP: (96-128)/(58-91) 123/91 (05/16 1500) ?SpO2:  [93 %-100 %] 93 % (05/16 1500) ?Weight:  [103.9 kg] 103.9 kg (05/15 2223) ?General appearance: alert and cooperative ?Resp: clear to auscultation bilaterally ?Cardio: regular rate and rhythm ?Extremities: extremities normal, atraumatic, no cyanosis or edema ?Except for left index finger, bite wounds on both ulnar and radial side ~ pipj with surrounding erythema, finger is swollen, tender along flexor tendon sheath ? ?Assessment: ?Dog bite left index finger with probable tenosynovitis ?Plan: ?Needs I&D ?I have discussed this treatment plan in detail with patient , including the risks of the recommended treatment or surgery, the benefits and the alternatives.  The patient understands that additional treatment may be necessary. ? ?Enes Wegener C Delailah Spieth ?07/15/2021, 3:44 PM  ? ? ?  ?

## 2021-07-15 NOTE — ED Notes (Signed)
Pt transported to short stay at this time. Prior to pt leaving, pt saw consent form that was filled out and ready to be signed that reported that pt was having her R index finger operated on. This RN verified the informed consent order which stated R index finger. Surgery is supposed to be on pt's L index finger. Notified OR immediately and they are going to contact the surgeon and get the order fixed. ?

## 2021-07-15 NOTE — Evaluation (Signed)
Occupational Therapy Evaluation ?Patient Details ?Name: Becky Lutz ?MRN: [id number] ?DOB: [birth year] ?Today's Date: 07/15/2021 ? ? ?History of Present Illness 56 yo female presenting to ED on 5/15 with dog bite to L hand. Exam of her left index finger consistent with tenosynovitis. Planning to go to OR for wash out. PMH including depression, anemia, ADHD, and achilles tendon sx L (2019).  ? ?Clinical Impression ?  ?PTA, pt was living with her wife and three children and was independent; working and driving. Pt currently performing at Mod I level for ADLs with increased time for pain management.  Pt with limited ROM at L index finger due to pain and swelling. Discussed ROM exercises and will continue to follow acutely as admitted to establish HEP. Recommend dc to home once medically stable per physician.    ? ?Recommendations for follow up therapy are one component of a multi-disciplinary discharge planning process, led by the attending physician.  Recommendations may be updated based on patient status, additional functional criteria and insurance authorization.  ? ?Follow Up Recommendations ? No OT follow up  ?  ?Assistance Recommended at Discharge None  ?Patient can return home with the following   ? ?  ?Functional Status Assessment ? Patient has had a recent decline in their functional status and demonstrates the ability to make significant improvements in function in a reasonable and predictable amount of time.  ?Equipment Recommendations ? None recommended by OT  ?  ?Recommendations for Other Services   ? ? ?  ?Precautions / Restrictions Precautions ?Precautions: None  ? ?  ? ?Mobility Bed Mobility ?Overal bed mobility: Independent ?  ?  ?  ?  ?  ?  ?  ?  ? ?Transfers ?Overall transfer level: Independent ?  ?  ?  ?  ?  ?  ?  ?  ?  ?  ? ?  ?Balance Overall balance assessment: Independent ?  ?  ?  ?  ?  ?  ?  ?  ?  ?  ?  ?  ?  ?  ?  ?  ?  ?  ?   ? ?ADL either performed or assessed with clinical judgement   ? ?ADL Overall ADL's : Modified independent ?  ?  ?  ?  ?  ?  ?  ?  ?  ?  ?  ?  ?  ?  ?  ?  ?  ?  ?  ?General ADL Comments: increased time and compensatory techniques to decreased pain at L hand.  ? ? ? ?Vision Baseline Vision/History: 1 Wears glasses ?Vision Assessment?: No apparent visual deficits  ?   ?Perception   ?  ?Praxis   ?  ? ?Pertinent Vitals/Pain Pain Assessment ?Pain Assessment: 0-10 ?Pain Score: 9  ?Pain Location: L index finger ?Pain Descriptors / Indicators: Grimacing, Guarding, Discomfort ?Pain Intervention(s): Monitored during session  ? ? ? ?Hand Dominance Right ?  ?Extremity/Trunk Assessment Upper Extremity Assessment ?Upper Extremity Assessment: LUE deficits/detail ?LUE Deficits / Details: L index finger red and swollen. Limited isolated movement due to pain and swelling. able to perform finger opposition with increased time and effort due to significant pain. Unabel to make fist. WFL for wrist, elbow, shoulder ?LUE Coordination: decreased fine motor ?  ?Lower Extremity Assessment ?Lower Extremity Assessment: Overall WFL for tasks assessed ?  ?Cervical / Trunk Assessment ?Cervical / Trunk Assessment: Normal ?  ?Communication Communication ?Communication: No difficulties ?  ?Cognition Arousal/Alertness: Awake/alert ?Behavior During Therapy:  WFL for tasks assessed/performed ?Overall Cognitive Status: Within Functional Limits for tasks assessed ?  ?  ?  ?  ?  ?  ?  ?  ?  ?  ?  ?  ?  ?  ?  ?  ?  ?  ?  ?General Comments  Wife present throughout. VSS ? ?  ?Exercises Exercises: Other exercises ?Other Exercises ?Other Exercises: Discussed ROM exercises for future as well as edema management. Will provide HEP at next session ?  ?Shoulder Instructions    ? ? ?Home Living Family/patient expects to be discharged to:: Private residence ?Living Arrangements: Spouse/significant other;Children ?Available Help at Discharge: Family;Available 24 hours/day ?Type of Home: House ?Home Access: Ramped entrance ?  ?   ?Home Layout: One level ?  ?  ?Bathroom Shower/Tub: Walk-in shower ?  ?Bathroom Toilet: Standard ?  ?  ?Home Equipment: Shower seat - built in (wife has DME) ?  ?  ?  ? ?  ?Prior Functioning/Environment Prior Level of Function : Independent/Modified Independent;Working/employed;Driving ?  ?  ?  ?  ?  ?  ?  ?  ?  ? ?  ?  ?OT Problem List: Decreased knowledge of precautions;Impaired UE functional use ?  ?   ?OT Treatment/Interventions: Self-care/ADL training;Therapeutic exercise;Therapeutic activities;Patient/family education  ?  ?OT Goals(Current goals can be found in the care plan section) Acute Rehab OT Goals ?Patient Stated Goal: Go home ?OT Goal Formulation: With patient/family ?Time For Goal Achievement: 07/29/21 ?Potential to Achieve Goals: Good  ?OT Frequency: Min 3X/week ?  ? ?Co-evaluation   ?  ?  ?  ?  ? ?  ?AM-PAC OT "6 Clicks" Daily Activity     ?Outcome Measure Help from another person eating meals?: None ?Help from another person taking care of personal grooming?: None ?Help from another person toileting, which includes using toliet, bedpan, or urinal?: None ?Help from another person bathing (including washing, rinsing, drying)?: None ?Help from another person to put on and taking off regular upper body clothing?: None ?Help from another person to put on and taking off regular lower body clothing?: None ?6 Click Score: 24 ?  ?End of Session Nurse Communication: Mobility status;Patient requests pain meds ? ?Activity Tolerance: Patient tolerated treatment well ?Patient left: in bed;with call bell/phone within reach;with family/visitor present ? ?OT Visit Diagnosis: Unsteadiness on feet (R26.81);Other abnormalities of gait and mobility (R26.89);Muscle weakness (generalized) (M62.81);Pain ?Pain - Right/Left: Left ?Pain - part of body: Hand  ?              ?Time: 4259-5638 ?OT Time Calculation (min): 15 min ?Charges:  OT General Charges ?$OT Visit: 1 Visit ?OT Evaluation ?$OT Eval Low Complexity: 1  Low ? ?Staci Dack MSOT, OTR/L ?Acute Rehab ?Pager: [phone] ?Office: [phone] ? ?Becky Lutz ?07/15/2021, 12:00 PM ?

## 2021-07-15 NOTE — ED Notes (Signed)
Pt has taken all clothes and jewelry off in preparation for surgery and gown was put on.  ?

## 2021-07-15 NOTE — ED Notes (Signed)
Procedure consent at bedside ready to be signed by pt, witness and provider. Provider hasn't spoke w/ pt in depth about procedure yet, so this RN is not obtaining consent at this time. ?

## 2021-07-15 NOTE — Plan of Care (Signed)
?  Problem: Education: Goal: Knowledge of General Education information will improve Description: Including pain rating scale, medication(s)/side effects and non-pharmacologic comfort measures Outcome: Not Progressing   Problem: Health Behavior/Discharge Planning: Goal: Ability to manage health-related needs will improve Outcome: Not Progressing   Problem: Clinical Measurements: Goal: Ability to maintain clinical measurements within normal limits will improve Outcome: Not Progressing Goal: Will remain free from infection Outcome: Not Progressing Goal: Diagnostic test results will improve Outcome: Not Progressing Goal: Respiratory complications will improve Outcome: Not Progressing Goal: Cardiovascular complication will be avoided Outcome: Not Progressing   Problem: Activity: Goal: Risk for activity intolerance will decrease Outcome: Not Progressing   Problem: Coping: Goal: Level of anxiety will decrease Outcome: Not Progressing   Problem: Elimination: Goal: Will not experience complications related to bowel motility Outcome: Not Progressing Goal: Will not experience complications related to urinary retention Outcome: Not Progressing   

## 2021-07-15 NOTE — ED Notes (Signed)
Singh MD at bedside

## 2021-07-15 NOTE — Assessment & Plan Note (Signed)
On Cymbalta at home. ?

## 2021-07-16 ENCOUNTER — Encounter (HOSPITAL_COMMUNITY): Payer: Self-pay | Admitting: General Surgery

## 2021-07-16 DIAGNOSIS — F988 Other specified behavioral and emotional disorders with onset usually occurring in childhood and adolescence: Secondary | ICD-10-CM | POA: Diagnosis not present

## 2021-07-16 DIAGNOSIS — M659 Synovitis and tenosynovitis, unspecified: Secondary | ICD-10-CM | POA: Diagnosis not present

## 2021-07-16 DIAGNOSIS — F32A Depression, unspecified: Secondary | ICD-10-CM | POA: Diagnosis not present

## 2021-07-16 LAB — CBC WITH DIFFERENTIAL/PLATELET
Abs Immature Granulocytes: 0.01 10*3/uL (ref 0.00–0.07)
Basophils Absolute: 0 10*3/uL (ref 0.0–0.1)
Basophils Relative: 0 %
Eosinophils Absolute: 0 10*3/uL (ref 0.0–0.5)
Eosinophils Relative: 0 %
HCT: 37.8 % (ref 36.0–46.0)
Hemoglobin: 12.6 g/dL (ref 12.0–15.0)
Immature Granulocytes: 0 %
Lymphocytes Relative: 9 %
Lymphs Abs: 0.5 10*3/uL — ABNORMAL LOW (ref 0.7–4.0)
MCH: 30.4 pg (ref 26.0–34.0)
MCHC: 33.3 g/dL (ref 30.0–36.0)
MCV: 91.3 fL (ref 80.0–100.0)
Monocytes Absolute: 0.1 10*3/uL (ref 0.1–1.0)
Monocytes Relative: 2 %
Neutro Abs: 4.6 10*3/uL (ref 1.7–7.7)
Neutrophils Relative %: 89 %
Platelets: 211 10*3/uL (ref 150–400)
RBC: 4.14 MIL/uL (ref 3.87–5.11)
RDW: 12.1 % (ref 11.5–15.5)
WBC: 5.2 10*3/uL (ref 4.0–10.5)
nRBC: 0 % (ref 0.0–0.2)

## 2021-07-16 LAB — MAGNESIUM: Magnesium: 2.1 mg/dL (ref 1.7–2.4)

## 2021-07-16 LAB — COMPREHENSIVE METABOLIC PANEL
ALT: 24 U/L (ref 0–44)
AST: 19 U/L (ref 15–41)
Albumin: 3 g/dL — ABNORMAL LOW (ref 3.5–5.0)
Alkaline Phosphatase: 55 U/L (ref 38–126)
Anion gap: 8 (ref 5–15)
BUN: 11 mg/dL (ref 6–20)
CO2: 24 mmol/L (ref 22–32)
Calcium: 9.2 mg/dL (ref 8.9–10.3)
Chloride: 108 mmol/L (ref 98–111)
Creatinine, Ser: 0.93 mg/dL (ref 0.44–1.00)
GFR, Estimated: >60 mL/min (ref >60)
Glucose, Bld: 168 mg/dL — ABNORMAL HIGH (ref 70–99)
Potassium: 4.1 mmol/L (ref 3.5–5.1)
Sodium: 140 mmol/L (ref 135–145)
Total Bilirubin: 0.1 mg/dL — ABNORMAL LOW (ref 0.3–1.2)
Total Protein: 6 g/dL — ABNORMAL LOW (ref 6.5–8.1)

## 2021-07-16 MED ORDER — BACITRACIN ZINC 500 UNIT/GM EX OINT
TOPICAL_OINTMENT | Freq: Four times a day (QID) | CUTANEOUS | Status: DC
  Filled 2021-07-16: qty 28.4

## 2021-07-16 MED ORDER — KETOROLAC TROMETHAMINE 15 MG/ML IJ SOLN
15.0000 mg | Freq: Four times a day (QID) | INTRAMUSCULAR | Status: DC | PRN
  Administered 2021-07-16 (×2): 15 mg via INTRAVENOUS
  Filled 2021-07-16 (×2): qty 1

## 2021-07-16 NOTE — Progress Notes (Signed)
MD came and provided pt. With instructions on how to care for L hand. Pt. Understood and wants to do it at a later time d/t pain. ?

## 2021-07-16 NOTE — Progress Notes (Addendum)
?PROGRESS NOTE ? ? ? ?Becky Lutz  HDQ:[id number] DOB: 1965/03/16 DOA: 07/14/2021 ?PCP: Ellamae Sia, MD  ? ?Brief Narrative:  ?HPI per Dr. Kristopher Oppenheim on 07/14/2021 ?56 year old white female with no significant medical history except for depression and attention deficit disorder who was bit on her left hand by her own dog on Saturday.  She went to [facility]  She was prescribed Augmentin.  She got it filled on Sunday.  First dose was Sunday night.  Her left index finger has been increasingly swollen.  Increasing pain.  Increasing swelling.  Brought back to the ER today. ?  ?Exam of her left index finger consistent with tenosynovitis. ?  ?EDP is contacted hand surgery is planning on taking the patient to the OR later on today for washout.  Labs are reassuring. ?  ?[facility] hospitalist contacted for admission.  ? ?**Interm History ?She came to the ED and Hand surgery was consulted and admitted for Left Index Finger and Hand Infection. She underwent I and D and operative management and is POD1. She remains on IV Unasyn and ID is actively following.   ? ? ?Assessment and Plan: ?Post dog bite infectious tenosynovitis of left index finger and some hand infection along with reactive infectious lymphadenopathy in the left axilla s/p I and D of Multiple Wounds of the Left Index Finger, Release of A1 Pulley of the Left index finger, and drainage of both the flexor and extensor Tendon sheaths of the Left Index Finger POD1 ?-X-Ray done and showed no bony changes ?-She has been placed on Unasyn and per ID continue  ?-Hand surgery has been consulted and she underwent Incision and drainage yesterday,  ?-Will monitor perioperative cultures, blood cultures and Follow ?-ID has also been consulted to address any possibility of exposure to rabies which seems unlikely.   ?-ESR was 20 and CRP was 5.2 ?-Dog was previously vaccinated for rabies but the expiration apparently expired few weeks prior to this event.    ?-Continue supportive care and c/w Antiemetics with IV Ondansetron 4 mg q6hprn Nasusea and IV Promethazine 12.5 mg q8hprn Nasuea and Vomiting ?-Pain Control with Acetaminophen 650 mg po 4 times Daily, Ketorolac 15 mg q6hprn Moderate Pain, and Oxycodone 5 mg po q6hprn Breakthrough  ?-Hand Pics per Dr. Bridgett Larsson on Admission: ?  ?  ?  ?  ?Depression ?-C/w Duloxetine 90 mg po daily  ?  ?Attention deficit disorder (ADD) in adult ?-On Adderall at home which has been held ? ?Hyperglycemia ?-Blood Sugars ranging from 115-168 on Daily CMP's ?-Check HbA1c ?-Continue to Monitor Blood Sugars carefully and if necessary place on Sensitive Novolog SSI AC ? ?Renal Insuffiencey  ?-Patient's BUN/Cr was 21/1.02 and has improved to 11/0.93 ?-Encourage oral intake ?-Avoid Further Nephrotoxic Medications, Contrast dyes, hypotension and dehydration and renally adjust medications ?-Repeat CMP in the AM  ?  ?Obesity ?-Complicates overall prognosis and care ?-Estimated body mass index is 36.96 kg/m? as calculated from the following: ?  Height as of this encounter: 5' 6"  (1.676 m). ?  Weight as of this encounter: 103.9 kg.  ?-Weight Loss and Dietary Counseling given ? ?DVT prophylaxis: SCDs Start: 07/15/21 0511 ? ?  Code Status: Full Code ?Family Communication: No family currently at bedside ? ?Disposition Plan:  ?Level of care: Med-Surg ?Status is: Inpatient ?Remains inpatient appropriate because: Continues to be on IV Unasyn and will need hand surgery clearance for safe discharge disposition ?  ?Consultants:  ?Hand Surgery ?Infectious diseases ? ?Procedures:  ?  PROCEDURE:  ?1.  Incision and drainage of multiple wounds of the left index finger. ?2.  Release of A1 pulley of the left index finger. ?3.  Drainage of both the flexor and extensor tendon sheaths of the left index finger. ? ?Antimicrobials:  ?Anti-infectives (From admission, onward)  ? ? Start     Dose/Rate Route Frequency Ordered Stop  ? 07/15/21 0500  Ampicillin-Sulbactam (UNASYN) 3 g  in sodium chloride 0.9 % 100 mL IVPB       ? 3 g ?200 mL/hr over 30 Minutes Intravenous Every 6 hours 07/15/21 0455    ? 07/15/21 0415  ampicillin-sulbactam (UNASYN) 1.5 g in sodium chloride 0.9 % 100 mL IVPB  Status:  Discontinued       ? 1.5 g ?200 mL/hr over 30 Minutes Intravenous  Once 07/15/21 0405 07/15/21 0455  ? ?  ?  ?Subjective: ?Seen and examined at bedside and feels okay.  States her hand hurts a little bit.  No nausea or vomiting.  Thinks that the Dilaudid made her feel weird and that the oxycodone made her nauseous and want to vomit.  Asking to substitute for ketorolac.  No other concerns or complaints at this time. ? ?Objective: ?Vitals:  ? 07/15/21 1904 07/16/21 0503 07/16/21 0546 07/16/21 1136  ?BP: 100/61 (!) 88/54 103/67 (!) 102/58  ?Pulse: 89 79 83 75  ?Resp: 16 16  16   ?Temp: (!) 97.5 ?F (36.4 ?C)   97.8 ?F (36.6 ?C)  ?TempSrc: Oral   Oral  ?SpO2: 97% 94%    ?Weight:      ?Height:      ? ? ?Intake/Output Summary (Last 24 hours) at 07/16/2021 1336 ?Last data filed at 07/15/2021 2153 ?Gross per 24 hour  ?Intake 862.14 ml  ?Output 170 ml  ?Net 692.14 ml  ? ?Filed Weights  ? 07/14/21 2223  ?Weight: 103.9 kg  ? ?Examination: ?Physical Exam: ? ?Constitutional: WN/WD obese Caucasian female currently in no acute distress appears calm ?Respiratory: Diminished to auscultation bilaterally, no wheezing, rales, rhonchi or crackles. Normal respiratory effort and patient is not tachypenic. No accessory muscle use.  Unlabored breathing ?Cardiovascular: RRR, no murmurs / rubs / gallops. S1 and S2 auscultated. No extremity edema.   ?Abdomen: Soft, non-tender, distended secondary body habitus.  Bowel sounds positive.  ?GU: Deferred. ?Musculoskeletal: Left first finger is wrapped ?Neurologic: CN 2-12 grossly intact with no focal deficits. Romberg sign and cerebellar reflexes not assessed.  ?Psychiatric: Normal judgment and insight. Alert and oriented x 3. Normal mood and appropriate affect.  ? ?Data Reviewed: I have  personally reviewed following labs and imaging studies ? ?CBC: ?Recent Labs  ?Lab 07/14/21 ?2304 07/16/21 ?0421  ?WBC 8.7 5.2  ?NEUTROABS 5.3 4.6  ?HGB 14.2 12.6  ?HCT 41.5 37.8  ?MCV 91.2 91.3  ?PLT 222 211  ? ?Basic Metabolic Panel: ?Recent Labs  ?Lab 07/14/21 ?2304 07/16/21 ?0421  ?NA 137 140  ?K 4.0 4.1  ?CL 107 108  ?CO2 22 24  ?GLUCOSE 115* 168*  ?BUN 21* 11  ?CREATININE 1.02* 0.93  ?CALCIUM 9.0 9.2  ?MG  --  2.1  ? ?GFR: ?Estimated Creatinine Clearance: 81.2 mL/min (by C-G formula based on SCr of 0.93 mg/dL). ?Liver Function Tests: ?Recent Labs  ?Lab 07/14/21 ?2304 07/16/21 ?0421  ?AST 29 19  ?ALT 26 24  ?ALKPHOS 61 55  ?BILITOT 0.6 0.1*  ?PROT 6.9 6.0*  ?ALBUMIN 3.8 3.0*  ? ?No results for input(s): LIPASE, AMYLASE in the last 168 hours. ?No  results for input(s): AMMONIA in the last 168 hours. ?Coagulation Profile: ?No results for input(s): INR, PROTIME in the last 168 hours. ?Cardiac Enzymes: ?No results for input(s): CKTOTAL, CKMB, CKMBINDEX, TROPONINI in the last 168 hours. ?BNP (last 3 results) ?No results for input(s): PROBNP in the last 8760 hours. ?HbA1C: ?No results for input(s): HGBA1C in the last 72 hours. ?CBG: ?No results for input(s): GLUCAP in the last 168 hours. ?Lipid Profile: ?No results for input(s): CHOL, HDL, LDLCALC, TRIG, CHOLHDL, LDLDIRECT in the last 72 hours. ?Thyroid Function Tests: ?No results for input(s): TSH, T4TOTAL, FREET4, T3FREE, THYROIDAB in the last 72 hours. ?Anemia Panel: ?No results for input(s): VITAMINB12, FOLATE, FERRITIN, TIBC, IRON, RETICCTPCT in the last 72 hours. ?Sepsis Labs: ?Recent Labs  ?Lab 07/14/21 ?2304 07/15/21 ?0440  ?LATICACIDVEN 0.8 1.2  ? ? ?Recent Results (from the past 240 hour(s))  ?Blood culture (routine x 2)     Status: None (Preliminary result)  ? Collection Time: 07/14/21 11:04 PM  ? Specimen: BLOOD RIGHT HAND  ?Result Value Ref Range Status  ? Specimen Description BLOOD RIGHT HAND  Final  ? Special Requests   Final  ?  BOTTLES DRAWN AEROBIC  AND ANAEROBIC Blood Culture adequate volume  ? Culture   Final  ?  NO GROWTH 2 DAYS ?Performed at [facility], [place] [address] [place], [place] [address] ?  ? Report Status PENDING  Incomplete  ?Bl

## 2021-07-16 NOTE — Op Note (Signed)
NAME: Becky Lutz, LOVERN ?MEDICAL RECORD NO: [id number] ?ACCOUNT NO: [id number] ?DATE OF BIRTH: 1965/09/01 ?FACILITY: [facility] ?LOCATION: [facility] ?PHYSICIAN: Donnovan Stamour C. Lenon Curt, MD ? ?Operative Report  ? ?DATE OF PROCEDURE: 07/15/2021 ? ?PREOPERATIVE DIAGNOSIS:  Dog bite to the left index finger with probable tenosynovitis. ? ?POSTOPERATIVE DIAGNOSIS:  Dog bite to the left index finger with probable tenosynovitis. ? ?PROCEDURE:  ?1.  Incision and drainage of multiple wounds of the left index finger. ?2.  Release of A1 pulley of the left index finger. ?3.  Drainage of both the flexor and extensor tendon sheaths of the left index finger. ? ?INDICATIONS:  The patient is a 56 year old young lady who was bitten by her dog couple of days ago.  She was placed on oral antibiotics and she became worse. She presented to the emergency department early this morning with worsening signs of infection  ?and now tenosynovitis.  I was consulted.  Risks, benefits and alternatives of I and D were discussed with the patient.  She agreed with I and D.  Consent was obtained. ? ?DESCRIPTION OF PROCEDURE:  The patient was taken to the operating room and placed supine on the operating room table.  Timeout was performed.  She had been on antibiotics previously.  So therefore, no additional antibiotics were given.  Anesthesia was  ?administered without difficulty.  The left upper extremity was prepped and draped in normal sterile fashion.  Attention was taken to the left index finger.  There were two significant bite wounds, both on the radial and ulnar side of the finger adjacent  ?to the PIP joint.  The one that was more dorsal, which was the radial wound was probed.  It seemed to extend along and into the extensor tendon sheath.  This was irrigated thoroughly with saline solution.  The skin edges were debrided sharply with  ?scissors and any nonviable tissue was removed.  Next, attention was taken to the smaller ulnar wound. However, this wound  tracked into the flexor tendon sheath.  A counterincision was made proximally over the A1 pulley.  Dissection was carried down to  ?the tendon sheath.  The A1 pulley was incised.  Angiocath was used to irrigate the flexor tendon sheath in an antegrade fashion and indeed fluid was encountered out the ulnar wound which signified breach of the flexor tendon sheath as expected.  Thorough ? irrigation of all these wounds were then performed.  A small piece of Xeroform was placed in each of the wounds to stent it open for adequate drainage.  Marcaine was infiltrated around the base of the finger for postoperative pain control.  Sterile  ?dressing was applied. ? ?ESTIMATED BLOOD LOSS:  Minimal. ? ?No specimens.  No acute complications. ? ? ?SHW ?D: 07/15/2021 4:47:40 pm T: 07/16/2021 12:18:00 am  ?JOB: [id number]/ [id number]  ?

## 2021-07-16 NOTE — Progress Notes (Signed)
Occupational Therapy Treatment ?Patient Details ?Name: Becky Lutz ?MRN: [id number] ?DOB: [birth year] ?Today's Date: 07/16/2021 ? ? ?History of present illness 56 yo female presenting to ED on 5/15 with dog bite to L hand. Exam of her left index finger consistent with tenosynovitis. Planning to go to OR for wash out. PMH including depression, anemia, ADHD, and achilles tendon sx L (2019). ?  ?OT comments ? All education is complete and patient indicates understanding.pt agrees to OT sign off without any questions.   ? ?Recommendations for follow up therapy are one component of a multi-disciplinary discharge planning process, led by the attending physician.  Recommendations may be updated based on patient status, additional functional criteria and insurance authorization. ?   ?Follow Up Recommendations ? No OT follow up  ?  ?Assistance Recommended at Discharge None  ?Patient can return home with the following ?   ?  ?Equipment Recommendations ? None recommended by OT  ?  ?Recommendations for Other Services   ? ?  ?Precautions / Restrictions Precautions ?Precautions: None  ? ? ?  ? ?Mobility Bed Mobility ?  ?  ?  ?  ?  ?  ?  ?  ?  ? ?Transfers ?  ?  ?  ?  ?  ?  ?  ?  ?  ?  ?  ?  ?Balance   ?  ?  ?  ?  ?  ?  ?  ?  ?  ?  ?  ?  ?  ?  ?  ?  ?  ?  ?   ? ?ADL either performed or assessed with clinical judgement  ? ?ADL   ?  ?  ?  ?  ?  ?  ?  ?  ?  ?  ?  ?  ?  ?  ?  ?  ?  ?  ?  ?  ?  ? ?Extremity/Trunk Assessment Upper Extremity Assessment ?Upper Extremity Assessment: LUE deficits/detail ?LUE Deficits / Details: pt is able to bring 1st and 3rd 4th 5th digits pad to pad with some restriction due to dressing on the hand. pt with 2nd digit is extension. pt reports no pain currently. swelling appears well managed and arm elevated on pillow. pt reports having a triangle pillow at home ?  ?  ?  ?  ?  ? ?Vision   ?  ?  ?Perception   ?  ?Praxis   ?  ? ?Cognition Arousal/Alertness: Awake/alert ?Behavior During Therapy: [facility] for  tasks assessed/performed ?Overall Cognitive Status: Within Functional Limits for tasks assessed ?  ?  ?  ?  ?  ?  ?  ?  ?  ?  ?  ?  ?  ?  ?  ?  ?  ?  ?  ?   ?Exercises Exercises: Other exercises ?Other Exercises ?Other Exercises: HEP [place] [id number] ?Other Exercises: educated on all exerices with return demo as bandage allowed. pt with return demo and verbalizations. ? ?  ?Shoulder Instructions   ? ? ?  ?General Comments    ? ? ?Pertinent Vitals/ Pain       Pain Assessment ?Pain Assessment: No/denies pain ? ?Home Living   ?  ?  ?  ?  ?  ?  ?  ?  ?  ?  ?  ?  ?  ?  ?  ?  ?  ?  ? ?  ?Prior Functioning/Environment    ?  ?  ?  ?   ? ?  Frequency ? Min 3X/week  ? ? ? ? ?  ?Progress Toward Goals ? ?OT Goals(current goals can now be found in the care plan section) ? Progress towards OT goals: Goals met/education completed, patient discharged from OT ? ?Acute Rehab OT Goals ?Patient Stated Goal: to go home ?OT Goal Formulation: With patient/family ?Time For Goal Achievement: 07/29/21 ?Potential to Achieve Goals: Good ?ADL Goals ?Pt/caregiver will Perform Home Exercise Program: Increased ROM;Increased strength;Left upper extremity;With written HEP provided;Independently  ?Plan Discharge plan remains appropriate   ? ?Co-evaluation ? ? ?   ?  ?  ?  ?  ? ?  ?AM-PAC OT "6 Clicks" Daily Activity     ?Outcome Measure ? ? Help from another person eating meals?: None ?Help from another person taking care of personal grooming?: None ?Help from another person toileting, which includes using toliet, bedpan, or urinal?: None ?Help from another person bathing (including washing, rinsing, drying)?: None ?Help from another person to put on and taking off regular upper body clothing?: None ?Help from another person to put on and taking off regular lower body clothing?: None ?6 Click Score: 24 ? ?  ?End of Session   ? ?OT Visit Diagnosis: Unsteadiness on feet (R26.81);Other abnormalities of gait and mobility (R26.89);Muscle weakness  (generalized) (M62.81);Pain ?Pain - Right/Left: Left ?Pain - part of body: Hand ?  ?Activity Tolerance Patient tolerated treatment well ?  ?Patient Left in bed;with call bell/phone within reach;with family/visitor present ?  ?Nurse Communication Mobility status;Patient requests pain meds ?  ? ?   ? ?Time: 0321-2248 ?OT Time Calculation (min): 12 min ? ?Charges: OT General Charges ?$OT Visit: 1 Visit ?OT Treatments ?$Therapeutic Exercise: 8-22 mins ? ? ?Brynn, OTR/L  ?Acute Rehabilitation Services ?Office: [phone] ?. ? ? ?Jeri Modena ?07/16/2021, 2:37 PM ?

## 2021-07-16 NOTE — Hospital Course (Addendum)
HPI per Dr. Kristopher Oppenheim on 07/14/2021 56 year old white female with no significant medical history except for depression and attention deficit disorder who was bit on her left hand by her own dog on Saturday.  She went to [facility].  She was prescribed Augmentin.  She got it filled on Sunday.  First dose was Sunday night.  Her left index finger has been increasingly swollen.  Increasing pain.  Increasing swelling.  Brought back to the ER today.   Exam of her left index finger consistent with tenosynovitis.   EDP is contacted hand surgery is planning on taking the patient to the OR later on today for washout.  Labs are reassuring.   [facility] contacted for admission.   **Interm History She came to the ED and Hand surgery was consulted and admitted for Left Index Finger and Hand Infection. She underwent I and D and operative management and is POD2. She remains on IV Unasyn and ID is actively following.   Had surgery evaluated and felt that her swelling had decreased and she decreased erythema and her packing was moved.  Her dressing was changed and Dr. Freada Bergeron recommended wound care with the patient to wash her hands and wounds with soap and water and gently squeeze to promote drainage and comfort 3 times a day until wound closed and moved right finger stiffness.  He is recommending follow-up in outpatient setting and ID recommended switching from IV Unasyn to p.o. Augmentin to complete 3-week course from 07/16/2021 for tenosynovitis and they will see the patient in clinic.  She is medically stable to be discharged at this time

## 2021-07-16 NOTE — Progress Notes (Signed)
Mobility Specialist Progress Note  ? ? 07/16/21 1430  ?Mobility  ?Activity Ambulated independently in hallway  ?Level of Assistance Standby assist, set-up cues, supervision of patient - no hands on  ?Assistive Device None  ?Distance Ambulated (ft) 800 ft  ?Activity Response Tolerated well  ?$Mobility charge 1 Mobility  ? ?Pt received in bed and agreeable. No complaints on walk. Returned to bed with call bell in reach.   ? ?Becky Lutz ?Mobility Specialist  ?Primary: 5N M.S. Phone: [phone] ?Secondary: 6N M.S. Phone: [phone] ?  ?

## 2021-07-16 NOTE — Anesthesia Postprocedure Evaluation (Signed)
Anesthesia Post Note ? ?Patient: Becky Lutz ? ?Procedure(s) Performed: IRRIGATION AND DEBRIDEMENT LEFT INDEX FINGER (Left: Index Finger) ? ?  ? ?Patient location during evaluation: PACU ?Anesthesia Type: General ?Level of consciousness: awake and alert ?Pain management: pain level controlled ?Vital Signs Assessment: post-procedure vital signs reviewed and stable ?Respiratory status: spontaneous breathing, nonlabored ventilation, respiratory function stable and patient connected to nasal cannula oxygen ?Cardiovascular status: blood pressure returned to baseline and stable ?Postop Assessment: no apparent nausea or vomiting ?Anesthetic complications: no ? ? ?No notable events documented. ? ?Last Vitals:  ?Vitals:  ? 07/16/21 0546 07/16/21 1136  ?BP: 103/67 (!) 102/58  ?Pulse: 83 75  ?Resp:  16  ?Temp:  36.6 ?C  ?SpO2:    ?  ?Last Pain:  ?Vitals:  ? 07/16/21 1742  ?TempSrc:   ?PainSc: 7   ? ? ?  ?  ?  ?  ?  ?  ? ?Santa Lighter ? ? ? ? ?

## 2021-07-16 NOTE — Progress Notes (Signed)
S:  hand feels better, still sore. ? ?O:Blood pressure (!) 102/58, pulse 75, temperature 97.8 ?F (36.6 ?C), temperature source Oral, resp. rate 16, height '5\' 6"'$  (1.676 m), weight 103.9 kg, SpO2 94 %. ? ?Dressing changed, decreased swelling, decreased erythema, packing removed ? ?A:s/p I&D dog bit wounds ? ? ?P: cont IV abx until d/c, wound care, pt to wash wounds with soap and water, gently squeeze to promote any drainage and cover TID until wounds close, move finger to prevent stiffness.  F/u in office ~1 wk ?

## 2021-07-16 NOTE — Plan of Care (Signed)

## 2021-07-17 ENCOUNTER — Other Ambulatory Visit (HOSPITAL_COMMUNITY): Payer: Self-pay

## 2021-07-17 DIAGNOSIS — M659 Synovitis and tenosynovitis, unspecified: Secondary | ICD-10-CM | POA: Diagnosis not present

## 2021-07-17 DIAGNOSIS — F988 Other specified behavioral and emotional disorders with onset usually occurring in childhood and adolescence: Secondary | ICD-10-CM | POA: Diagnosis not present

## 2021-07-17 LAB — COMPREHENSIVE METABOLIC PANEL
ALT: 18 U/L (ref 0–44)
AST: 14 U/L — ABNORMAL LOW (ref 15–41)
Albumin: 2.8 g/dL — ABNORMAL LOW (ref 3.5–5.0)
Alkaline Phosphatase: 41 U/L (ref 38–126)
Anion gap: 5 (ref 5–15)
BUN: 20 mg/dL (ref 6–20)
CO2: 26 mmol/L (ref 22–32)
Calcium: 8.9 mg/dL (ref 8.9–10.3)
Chloride: 111 mmol/L (ref 98–111)
Creatinine, Ser: 1.03 mg/dL — ABNORMAL HIGH (ref 0.44–1.00)
GFR, Estimated: >60 mL/min (ref >60)
Glucose, Bld: 131 mg/dL — ABNORMAL HIGH (ref 70–99)
Potassium: 3.8 mmol/L (ref 3.5–5.1)
Sodium: 142 mmol/L (ref 135–145)
Total Bilirubin: 0.3 mg/dL (ref 0.3–1.2)
Total Protein: 5.4 g/dL — ABNORMAL LOW (ref 6.5–8.1)

## 2021-07-17 LAB — CBC WITH DIFFERENTIAL/PLATELET
Abs Immature Granulocytes: 0.02 10*3/uL (ref 0.00–0.07)
Basophils Absolute: 0 10*3/uL (ref 0.0–0.1)
Basophils Relative: 0 %
Eosinophils Absolute: 0 10*3/uL (ref 0.0–0.5)
Eosinophils Relative: 0 %
HCT: 35.5 % — ABNORMAL LOW (ref 36.0–46.0)
Hemoglobin: 11.5 g/dL — ABNORMAL LOW (ref 12.0–15.0)
Immature Granulocytes: 0 %
Lymphocytes Relative: 19 %
Lymphs Abs: 1.7 10*3/uL (ref 0.7–4.0)
MCH: 30.2 pg (ref 26.0–34.0)
MCHC: 32.4 g/dL (ref 30.0–36.0)
MCV: 93.2 fL (ref 80.0–100.0)
Monocytes Absolute: 0.7 10*3/uL (ref 0.1–1.0)
Monocytes Relative: 7 %
Neutro Abs: 6.6 10*3/uL (ref 1.7–7.7)
Neutrophils Relative %: 74 %
Platelets: 210 10*3/uL (ref 150–400)
RBC: 3.81 MIL/uL — ABNORMAL LOW (ref 3.87–5.11)
RDW: 12.4 % (ref 11.5–15.5)
WBC: 9 10*3/uL (ref 4.0–10.5)
nRBC: 0 % (ref 0.0–0.2)

## 2021-07-17 LAB — MAGNESIUM: Magnesium: 1.9 mg/dL (ref 1.7–2.4)

## 2021-07-17 MED ORDER — BACITRACIN ZINC 500 UNIT/GM EX OINT: TOPICAL_OINTMENT | Freq: Four times a day (QID) | CUTANEOUS | 0 refills | Status: DC

## 2021-07-17 MED ORDER — OXYCODONE HCL 5 MG PO TABS: 5.0000 mg | ORAL_TABLET | Freq: Four times a day (QID) | ORAL | 0 refills | Status: DC | PRN

## 2021-07-17 MED ORDER — ACETAMINOPHEN 325 MG PO TABS: 650.0000 mg | ORAL_TABLET | Freq: Four times a day (QID) | ORAL | 0 refills | Status: DC

## 2021-07-17 MED ORDER — KETOROLAC TROMETHAMINE 10 MG PO TABS: 10.0000 mg | ORAL_TABLET | Freq: Three times a day (TID) | ORAL | 0 refills | Status: DC | PRN

## 2021-07-17 MED ORDER — AMOXICILLIN-POT CLAVULANATE 875-125 MG PO TABS
1.0000 | ORAL_TABLET | Freq: Two times a day (BID) | ORAL | 0 refills | Status: DC
Start: 2021-07-17 — End: 2021-07-24
  Filled 2021-07-17: qty 22, 11d supply, fill #0

## 2021-07-17 NOTE — Discharge Summary (Signed)
Physician Discharge Summary   Patient: Becky Lutz MRN: [id number] DOB: [birth year]  Admit date:     07/14/2021  Discharge date: 07/17/21  Discharge Physician: Raiford Noble, DO   PCP: Ellamae Sia, MD   Recommendations at discharge:    Follow up with PCP within 1-2 weeks and repeat CBC,CMP,Mag, Phos within 1 week Follow up with Hand Surgery Dr. Lenon Curt within 1 week Follow up with ID as an outpatient within 1-2 weeks and c/w Augmenting for 3 week course.   Discharge Diagnoses: Principal Problem:   Tenosynovitis of finger Active Problems:   Depression   Attention deficit disorder (ADD) in adult  Resolved Problems:   * No resolved hospital problems. [facility] Course: HPI per Dr. Kristopher Oppenheim on 07/14/2021 56 year old white female with no significant medical history except for depression and attention deficit disorder who was bit on her left hand by her own dog on Saturday.  She went to [facility].  She was prescribed Augmentin.  She got it filled on Sunday.  First dose was Sunday night.  Her left index finger has been increasingly swollen.  Increasing pain.  Increasing swelling.  Brought back to the ER today.   Exam of her left index finger consistent with tenosynovitis.   EDP is contacted hand surgery is planning on taking the patient to the OR later on today for washout.  Labs are reassuring.   [facility] hospitalist contacted for admission.   **Interm History She came to the ED and Hand surgery was consulted and admitted for Left Index Finger and Hand Infection. She underwent I and D and operative management and is POD2. She remains on IV Unasyn and ID is actively following.   Had surgery evaluated and felt that her swelling had decreased and she decreased erythema and her packing was moved.  Her dressing was changed and Dr. Freada Bergeron recommended wound care with the patient to wash her hands and wounds with soap and water and gently squeeze to promote drainage and  comfort 3 times a day until wound closed and moved right finger stiffness.  He is recommending follow-up in outpatient setting and ID recommended switching from IV Unasyn to p.o. Augmentin to complete 3-week course from 07/16/2021 for tenosynovitis and they will see the patient in clinic.  She is medically stable to be discharged at this time  Assessment and Plan: Post dog bite infectious tenosynovitis of left index finger and some hand infection along with reactive infectious lymphadenopathy in the left axilla s/p I and D of Multiple Wounds of the Left Index Finger, Release of A1 Pulley of the Left index finger, and drainage of both the flexor and extensor Tendon sheaths of the Left Index Finger POD2 -X-Ray done and showed no bony changes -She has been placed on Unasyn and per ID continue  -Hand surgery has been consulted and she underwent Incision and drainage yesterday,  -Will monitor perioperative cultures, blood cultures and Follow -ID has also been consulted to address any possibility of exposure to rabies which seems unlikely.   -ESR was 20 and CRP was 5.2 -Dog was previously vaccinated for rabies but the expiration apparently expired few weeks prior to this event.   -Continue supportive care and c/w Antiemetics with IV Ondansetron 4 mg q6hprn Nasusea and IV Promethazine 12.5 mg q8hprn Nasuea and Vomiting -Pain Control with Acetaminophen 650 mg po 4 times Daily, Ketorolac 15 mg q6hprn Moderate Pain, and Oxycodone 5 mg po q6hprn Breakthrough  -Hand Pics per  Dr. 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 Dilutional Drop given IVF -Anemia panel in outpatient setting -She is to monitor for signs and symptoms bleeding; no overt bleeding noted   Obesity -Complicates overall prognosis and care -Estimated body mass index is 36.96 kg/m as calculated from the following:   Height as of this encounter: 5' 6"  (1.676 m).   Weight as of this encounter: 103.9 kg.  -Weight Loss and Dietary Counseling given    Consultants: Hand Surgery, ID Procedures performed: PROCEDURE:  1.  Incision and drainage of multiple wounds of the left index finger. 2.  Release of A1 pulley of the left index finger. 3.  Drainage of both the flexor and extensor tendon sheaths of the left index finger.  Disposition: Home Diet recommendation:  Discharge Diet Orders (From admission, onward)     Start     Ordered   07/17/21 0000  Diet - low sodium heart healthy        07/17/21 1135           Cardiac diet DISCHARGE MEDICATION: Allergies as of 07/17/2021       Reactions   Codeine Other (See Comments)   Vomiting        Medication List     TAKE these medications    acetaminophen 325 MG tablet Commonly known as: TYLENOL Take 2 tablets (650 mg total) by mouth 4 (four) times daily. What changed:  medication strength how much to take when to take this reasons to take this   amoxicillin-clavulanate 875-125 MG tablet Commonly known as: AUGMENTIN Take 1 tablet by mouth 2 (two) times daily. Continue to take previous prescription of Amoxicillin to finish 3 weeks of antibiotics What changed: additional instructions   bacitracin ointment Apply topically 4 (four) times daily.    dextroamphetamine 10 MG tablet Commonly known as: DEXTROSTAT Take 10 mg by mouth daily.   DULoxetine 60 MG capsule Commonly known as: CYMBALTA Take 60 mg by mouth daily. Along with 30 mg   DULoxetine 30 MG capsule Commonly known as: CYMBALTA Take 30 mg by mouth daily. Along with 60 mg   EXCEDRIN MIGRAINE PO Take 1-2 tablets by mouth 2 (two) times daily as needed (migraine).   ketorolac 10 MG tablet Commonly known as: TORADOL Take 1 tablet (10 mg total) by mouth every 8 (eight) hours as needed (migraines).   oxyCODONE 5 MG immediate release tablet Commonly known as: Oxy IR/ROXICODONE Take 1 tablet (5 mg total) by mouth every 6 (six) hours as needed for breakthrough pain.               Discharge Care Instructions  (From admission, onward)           Start     Ordered  07/17/21 0000  Discharge wound care:       Comments: Per Hand Surgery -wound care, pt to wash wounds with soap and water, gently squeeze to promote any drainage and cover TID until wounds close, move finger to prevent stiffness.  F/u in office ~1 wk   07/17/21 1135            Follow-up Information     Dayna Barker, MD. Schedule an appointment as soon as possible for a visit in 1 week(s).   Specialty: General Surgery Contact information: [address]  [place] [address] [phone]         Rosiland Oz, MD Follow up on 08/06/2021.   Specialty: Infectious Diseases Why: 1:45 pm hospital follow up. Please arrive 15 min early to register. [phone] to reschedule appointment Contact information: [address] [place] [address] [phone]         Ellamae Sia, MD. Call.   Specialty: Internal Medicine Why: Follow up within 1-2 weeks Contact information: [place] [address] [phone]                Discharge Exam: Filed Weights   07/14/21 2223  Weight: 103.9 kg   Vitals:   07/17/21 0504 07/17/21 0741   BP: 114/78 102/76  Pulse: 75 77  Resp: 18 16  Temp:  97.8 F (36.6 C)  SpO2: 95% 93%    Examination: Physical Exam:  Constitutional: WN/WD obese Caucasian female in NAD Respiratory: Diminished to auscultation bilaterally, no wheezing, rales, rhonchi or crackles. Normal respiratory effort and patient is not tachypenic. No accessory muscle use.  Cardiovascular: RRR, no murmurs / rubs / gallops. S1 and S2 auscultated.   Abdomen: Soft, non-tender, Distended 2/2 body habitus. Bowel sounds positive.  GU: Deferred. Skin: Hand is wrapped  Neurologic: CN 2-12 grossly intact with no focal deficits.  Psychiatric: Normal judgment and insight. Alert and oriented x 3. Normal mood and appropriate affect.   Condition at discharge: stable  The results of significant diagnostics from this hospitalization (including imaging, microbiology, ancillary and laboratory) are listed below for reference.   Imaging Studies: DG Hand Complete Left  Result Date: 07/12/2021 CLINICAL DATA:  Status post dog bite. EXAM: LEFT HAND - COMPLETE 3+ VIEW COMPARISON:  None Available. FINDINGS: There is no evidence of fracture or dislocation. There is no evidence of arthropathy or other focal bone abnormality. Soft tissues are unremarkable. IMPRESSION: Negative. Electronically Signed   By: Virgina Norfolk M.D.   On: 07/12/2021 22:04    Microbiology: Results for orders placed or performed during the hospital encounter of 07/14/21  Blood culture (routine x 2)     Status: None (Preliminary result)   Collection Time: 07/14/21 11:04 PM   Specimen: BLOOD RIGHT HAND  Result Value Ref Range Status   Specimen Description BLOOD RIGHT HAND  Final   Special Requests   Final    BOTTLES DRAWN AEROBIC AND ANAEROBIC Blood Culture adequate volume   Culture   Final    NO GROWTH 3 DAYS Performed at [facility], [place] [address], [place], [place] [address]    Report Status PENDING  Incomplete  Blood culture (routine x 2)      Status: None (Preliminary result)   Collection Time: 07/15/21  4:40 AM   Specimen: BLOOD  Result Value Ref Range Status   Specimen Description BLOOD SITE NOT SPECIFIED  Final   Special Requests   Final  BOTTLES DRAWN AEROBIC AND ANAEROBIC Blood Culture adequate volume   Culture   Final    NO GROWTH 2 DAYS Performed at [facility], [place] [address] [place], [place] [address]    Report Status PENDING  Incomplete    Labs: CBC: Recent Labs  Lab 07/14/21 2304 07/16/21 0421 07/17/21 0211  WBC 8.7 5.2 9.0  NEUTROABS 5.3 4.6 6.6  HGB 14.2 12.6 11.5*  HCT 41.5 37.8 35.5*  MCV 91.2 91.3 93.2  PLT 222 211 332   Basic Metabolic Panel: Recent Labs  Lab 07/14/21 2304 07/16/21 0421 07/17/21 0211  NA 137 140 142  K 4.0 4.1 3.8  CL 107 108 111  CO2 22 24 26   GLUCOSE 115* 168* 131*  BUN 21* 11 20  CREATININE 1.02* 0.93 1.03*  CALCIUM 9.0 9.2 8.9  MG  --  2.1 1.9   Liver Function Tests: Recent Labs  Lab 07/14/21 2304 07/16/21 0421 07/17/21 0211  AST 29 19 14*  ALT 26 24 18   ALKPHOS 61 55 41  BILITOT 0.6 0.1* 0.3  PROT 6.9 6.0* 5.4*  ALBUMIN 3.8 3.0* 2.8*   CBG: No results for input(s): GLUCAP in the last 168 hours.  Discharge time spent: greater than 30 minutes.  Signed: Raiford Noble, DO [facility] 07/17/2021

## 2021-07-17 NOTE — Progress Notes (Signed)
PT AVS reviewed and pt verbalized understanding of all DC teaching and instructions. Pt leaving with family as transportation

## 2021-07-17 NOTE — Progress Notes (Addendum)
I have seen and examined the patient. I have personally reviewed the clinical findings, laboratory findings, microbiological data and imaging studies. The assessment and treatment plan was discussed with the Nurse Practitioner Janene Madeira. I agree with her/his recommendations except following additions/corrections.  Afebrile, no leukocytosis 5/15 blood cx no growth in 2 days   Decreased pain/swelling and erythema in the left pointer finger.  Surgical packing removed yesterday and noted to have overall improved exam. No further surgical plans noted.  No surgical cultures sent from OR.   OK to switch to Augmentin on discharge to complete 3 weeks course from 5/17 for tenosynovitis.  Fu with surgery Will follow in the clinic.  Please call with questions.   Rosiland Oz, MD Infectious Disease Physician [facility] [address] [place], [place] [address] Phone: [phone]  Fax: [place] [facility] Infect[facility]  Date of Admission:  07/14/2021      Total days of antibiotics 2   Unasyn         ASSESSMENT: Becky Lutz is a 56 y.o. female POD 2 following necessary I&D to left pointer finger/hand following dog bite. Unfortunately no cultures sent intra op to guide further antibiotic decisions.   Augmentin is good reasonable choice to cover presumptive pathogens here. Will make sure she has enough to finish out 3 weeks worth to cover for deeper infection. She will follow up with Dr. Lenon Curt closely for wound checks and verbalized instructions for wound care today.   I will make an appointment with Dr. [place] Bali on 6/7 @ 1:45 pm at the end of her therapy for evaluation to ensure antibiotics do not need extension. Encouraged her to come for earlier follow up if there is concern from surgery team.   OK for discharge from ID standpoint - will arrange TOC to bring her abx to bedside.    PLAN: Switch  to Augmentin 875/125 mg BID to complete 3 weeks from surgery (she has 8 tablets at home that she will also finish up).  TOC to bring other abx to her  OK to D/C from ID stand point Follow up arranged   Principal Problem:   Tenosynovitis of finger Active Problems:   Depression   Attention deficit disorder (ADD) in adult    acetaminophen  650 mg Oral QID   bacitracin   Topical QID   DULoxetine  90 mg Oral Daily    SUBJECTIVE: Her finger feels much better after surgery. Described incisions and wound care to [place]. She has 8 tablets of augmentin at home if needed.  No fevers/chills.  No s/e to current antibiotics     Review of Systems: Review of Systems  Constitutional:  Negative for chills and fever.  Gastrointestinal:  Negative for abdominal pain, nausea and vomiting.  Musculoskeletal:  Positive for joint pain (left hand/finger discomfort but much improved).  Skin:  Negative for itching and rash.  Neurological:  Negative for dizziness.     Allergies  Allergen Reactions   Codeine Other (See Comments)    Vomiting    OBJECTIVE: Vitals:   07/16/21 1136 07/16/21 2103 07/17/21 0504 07/17/21 0741  BP: (!) 102/58 112/72 114/78 102/76  Pulse: 75 87 75 77  Resp: '16 18 18 16  '$ Temp: 97.8 F (36.6 C) 99.2 F (37.3 C)  97.8 F (36.6 C)  TempSrc: Oral Oral  Oral  SpO2:  93% 95% 93%  Weight:  Height:       Body mass index is 36.96 kg/m.  Physical Exam Vitals reviewed.  Constitutional:      Appearance: Normal appearance.  Cardiovascular:     Rate and Rhythm: Normal rate.  Pulmonary:     Effort: Pulmonary effort is normal.  Musculoskeletal:     Comments: Left hand is wrapped in clean gauze dressing. No drainage. Good neurovascular check to distal exposed finger tips.   Skin:    General: Skin is warm and dry.  Neurological:     Mental Status: She is alert.    Lab Results Lab Results  Component Value Date   WBC 9.0 07/17/2021   HGB 11.5 (L) 07/17/2021   HCT  35.5 (L) 07/17/2021   MCV 93.2 07/17/2021   PLT 210 07/17/2021    Lab Results  Component Value Date   CREATININE 1.03 (H) 07/17/2021   BUN 20 07/17/2021   NA 142 07/17/2021   K 3.8 07/17/2021   CL 111 07/17/2021   CO2 26 07/17/2021    Lab Results  Component Value Date   ALT 18 07/17/2021   AST 14 (L) 07/17/2021   ALKPHOS 41 07/17/2021   BILITOT 0.3 07/17/2021     Microbiology: Recent Results (from the past 240 hour(s))  Blood culture (routine x 2)     Status: None (Preliminary result)   Collection Time: 07/14/21 11:04 PM   Specimen: BLOOD RIGHT HAND  Result Value Ref Range Status   Specimen Description BLOOD RIGHT HAND  Final   Special Requests   Final    BOTTLES DRAWN AEROBIC AND ANAEROBIC Blood Culture adequate volume   Culture   Final    NO GROWTH 3 DAYS Performed at [facility], [place] [address], [place], [place] [address]    Report Status PENDING  Incomplete  Blood culture (routine x 2)     Status: None (Preliminary result)   Collection Time: 07/15/21  4:40 AM   Specimen: BLOOD  Result Value Ref Range Status   Specimen Description BLOOD SITE NOT SPECIFIED  Final   Special Requests   Final    BOTTLES DRAWN AEROBIC AND ANAEROBIC Blood Culture adequate volume   Culture   Final    NO GROWTH 2 DAYS Performed at [facility], [address], [place],  [address]    Report Status PENDING  Incomplete   DG Hand Complete Left  Result Date: 07/12/2021 CLINICAL DATA:  Status post dog bite. EXAM: LEFT HAND - COMPLETE 3+ VIEW COMPARISON:  None Available. FINDINGS: There is no evidence of fracture or dislocation. There is no evidence of arthropathy or other focal bone abnormality. Soft tissues are unremarkable. IMPRESSION: Negative. Electronically Signed   By: Virgina Norfolk M.D.   On: 07/12/2021 22:04    Janene Madeira, MSN, NP-C [facility] [email or web address] Pager:  [phone] Office: [phone] [facility] Main Line: Regino Ramirez Communication Welcome

## 2021-07-19 LAB — CULTURE, BLOOD (ROUTINE X 2)
Culture: NO GROWTH
Special Requests: ADEQUATE

## 2021-07-20 LAB — CULTURE, BLOOD (ROUTINE X 2)
Culture: NO GROWTH
Special Requests: ADEQUATE

## 2021-07-23 DIAGNOSIS — M79605 Pain in left leg: Secondary | ICD-10-CM | POA: Diagnosis not present

## 2021-07-23 DIAGNOSIS — E039 Hypothyroidism, unspecified: Secondary | ICD-10-CM | POA: Diagnosis not present

## 2021-07-23 DIAGNOSIS — E1122 Type 2 diabetes mellitus with diabetic chronic kidney disease: Secondary | ICD-10-CM | POA: Diagnosis not present

## 2021-07-23 DIAGNOSIS — I10 Essential (primary) hypertension: Secondary | ICD-10-CM | POA: Diagnosis not present

## 2021-07-23 DIAGNOSIS — D6851 Activated protein C resistance: Secondary | ICD-10-CM | POA: Diagnosis not present

## 2021-07-24 ENCOUNTER — Ambulatory Visit (INDEPENDENT_AMBULATORY_CARE_PROVIDER_SITE_OTHER): Payer: BC Managed Care – PPO | Admitting: Nurse Practitioner

## 2021-07-24 ENCOUNTER — Encounter: Payer: Self-pay | Admitting: Nurse Practitioner

## 2021-07-24 VITALS — BP 117/83 | HR 109 | Temp 97.4°F | Ht 66.14 in | Wt 227.8 lb

## 2021-07-24 DIAGNOSIS — F32A Depression, unspecified: Secondary | ICD-10-CM | POA: Diagnosis not present

## 2021-07-24 DIAGNOSIS — Z7689 Persons encountering health services in other specified circumstances: Secondary | ICD-10-CM

## 2021-07-24 DIAGNOSIS — L03012 Cellulitis of left finger: Secondary | ICD-10-CM | POA: Diagnosis not present

## 2021-07-24 DIAGNOSIS — R7303 Prediabetes: Secondary | ICD-10-CM | POA: Diagnosis not present

## 2021-07-24 LAB — POCT GLYCOSYLATED HEMOGLOBIN (HGB A1C): Hemoglobin A1C: 5.7 % — AB (ref 4.0–5.6)

## 2021-07-24 MED ORDER — AMOXICILLIN-POT CLAVULANATE 875-125 MG PO TABS: 1.0000 | ORAL_TABLET | Freq: Two times a day (BID) | ORAL | 0 refills | Status: DC

## 2021-07-24 NOTE — Progress Notes (Signed)
New Patient Office Visit  Subjective    Patient ID: Becky Lutz, female    DOB: [birth year]  Age: 56 y.o. MRN: [id number]  CC:  Chief Complaint  Patient presents with   New Patient (Initial Visit)    HPI Becky Lutz presents to establish care -patient states that she has not had primary care provider for some time.  -recently was bitten by a dog. Injury most severe to the left index finger. Did have nick to the tendon sheath of left index finger. Went to ER on 07/17/2021. Received one stitch and no antibiotics. Wound became infections. She did go back to the ER. Was admitted. Had IV antibiotics for two days and surgical repair of the tendon. She is currently on augmentin. This will run out. She has follow up with hand surgeon on 07/30/2021 and follow up with infectious disease is 08/06/2021.  She was contacted per her insurance contacted her after hospitalization and was told that blood sugar was elevated during her stay.  Reviewing hospitalization records, highest blood sugar was 168. No HgbA1c was done.  -due to have routine, fasting labs.  -due to have annual physical.   Outpatient Encounter Medications as of 07/24/2021  Medication Sig   acetaminophen (TYLENOL) 325 MG tablet Take 2 tablets (650 mg total) by mouth 4 (four) times daily.   Aspirin-Acetaminophen-Caffeine (EXCEDRIN MIGRAINE PO) Take 1-2 tablets by mouth 2 (two) times daily as needed (migraine).   bacitracin ointment Apply topically 4 (four) times daily.   dextroamphetamine (DEXTROSTAT) 10 MG tablet Take 10 mg by mouth daily.   DULoxetine (CYMBALTA) 30 MG capsule Take 30 mg by mouth daily. Along with 60 mg   DULoxetine (CYMBALTA) 60 MG capsule Take 60 mg by mouth daily. Along with 30 mg   ketorolac (TORADOL) 10 MG tablet Take 1 tablet (10 mg total) by mouth every 8 (eight) hours as needed (migraines).   [DISCONTINUED] amoxicillin-clavulanate (AUGMENTIN) 875-125 MG tablet Take 1 tablet by mouth 2 (two) times  daily. Continue to take previous prescription of Amoxicillin to finish 3 weeks of antibiotics   amoxicillin-clavulanate (AUGMENTIN) 875-125 MG tablet Take 1 tablet by mouth 2 (two) times daily. Continue to take previous prescription of Amoxicillin to finish 3 weeks of antibiotics   [DISCONTINUED] oxyCODONE (OXY IR/ROXICODONE) 5 MG immediate release tablet Take 1 tablet (5 mg total) by mouth every 6 (six) hours as needed for breakthrough pain.   No facility-administered encounter medications on file as of 07/24/2021.    Past Medical History:  Diagnosis Date   Anemia    AT AGE 63 AFTER GIVING BLOOD   Anxiety    Attention deficit disorder (ADD) in adult    Complication of anesthesia    WAKES UP FIGHTING   Depression    Family history of adverse reaction to anesthesia    NEPHEW WAKES UP FIGHTING DUE TO VERSED   Headache    Pre-diabetes    Sleep apnea    CPAP    Past Surgical History:  Procedure Laterality Date   ABDOMINAL HYSTERECTOMY     2006   ACHILLES TENDON SURGERY Left 10/22/2017   Procedure: ACHILLES TENDON REPAIR-SECONDARY;  Surgeon: [name], DPM;  Location: [facility];  Service: Podiatry;  Laterality: Left;   arm surgery move nerve  Right    [place]   COLONOSCOPY WITH PROPOFOL N/A 08/16/2017   Procedure: COLONOSCOPY WITH PROPOFOL;  Surgeon: Lin Landsman, MD;  Location: [facility];  Service: Gastroenterology;  Laterality: N/A;   HALLUX VALGUS LAPIDUS Left 10/22/2017   Procedure: HALLUX VALGUS LAPIDUS;  Surgeon: [name], DPM;  Location: [facility];  Service: Podiatry;  Laterality: Left;   I & D EXTREMITY Left 07/15/2021   Procedure: IRRIGATION AND DEBRIDEMENT LEFT INDEX FINGER;  Surgeon: Dayna Barker, MD;  Location: [place];  Service: Plastics;  Laterality: Left;   OSTECTOMY Left 10/22/2017   Procedure: OSTECTOMY-HAGLUNDS/RECTROCALCANEAL;  Surgeon: [name], DPM;  Location: [facility];  Service: Podiatry;  Laterality:  Left;    Family History  Problem Relation Age of Onset   Cancer Mother    Ovarian cancer Mother 56       not sure ovarian or uterine    Heart attack Maternal Grandmother    Cancer Other     Social History   Socioeconomic History   Marital status: Married    Spouse name: Not on file   Number of children: Not on file   Years of education: Not on file   Highest education level: Not on file  Occupational History   Not on file  Tobacco Use   Smoking status: Never   Smokeless tobacco: Never  Vaping Use   Vaping Use: Never used  Substance and Sexual Activity   Alcohol use: Yes    Comment: RARE 1-2 YEARLY   Drug use: Never   Sexual activity: Not Currently  Other Topics Concern   Not on file  Social History Narrative   Not on file   Social Determinants of Health   Financial Resource Strain: Not on file  Food Insecurity: Not on file  Transportation Needs: Not on file  Physical Activity: Not on file  Stress: Not on file  Social Connections: Not on file  Intimate Partner Violence: Not on file    Review of Systems  Constitutional:  Negative for chills, fever and malaise/fatigue.  HENT:  Negative for congestion, sinus pain and sore throat.   Eyes: Negative.   Respiratory:  Negative for cough, shortness of breath and wheezing.   Cardiovascular:  Negative for chest pain, palpitations and leg swelling.  Gastrointestinal:  Negative for constipation, diarrhea, nausea and vomiting.  Genitourinary: Negative.   Musculoskeletal:  Negative for myalgias.  Skin: Negative.        Recent dog bite to left index finger which had to be staged and then surgically repaired but went to tendon of left index finger.  Overall status continues to improve per patient.  Neurological:  Negative for dizziness and headaches.  Endo/Heme/Allergies:  Does not bruise/bleed easily.  Psychiatric/Behavioral:  Positive for depression. The patient is nervous/anxious.        Patient does see psychiatric  provider on routine basis.       Objective    BP 117/83   Pulse (!) 109   Temp (!) 97.4 F (36.3 C)   Ht 5' 6.14" (1.68 m)   Wt 227 lb 12.8 oz (103.3 kg)   SpO2 97%   BMI 36.61 kg/m   Physical Exam Vitals and nursing note reviewed.  Constitutional:      Appearance: Normal appearance. She is well-developed.  HENT:     Head: Normocephalic and atraumatic.     Nose: Nose normal.     Mouth/Throat:     Mouth: Mucous membranes are moist.     Pharynx: Oropharynx is clear.  Eyes:     Extraocular Movements: Extraocular movements intact.     Conjunctiva/sclera: Conjunctivae normal.  Pupils: Pupils are equal, round, and reactive to light.  Cardiovascular:     Rate and Rhythm: Normal rate and regular rhythm.     Pulses: Normal pulses.     Heart sounds: Normal heart sounds.  Pulmonary:     Effort: Pulmonary effort is normal.     Breath sounds: Normal breath sounds.  Abdominal:     Palpations: Abdomen is soft.  Musculoskeletal:        General: Normal range of motion.     Cervical back: Normal range of motion and neck supple.  Lymphadenopathy:     Cervical: No cervical adenopathy.  Skin:    General: Skin is warm and dry.     Capillary Refill: Capillary refill takes less than 2 seconds.     Comments: With complaints from left hand.  Left index finger appears to be healing well. Denies noticing swelling present.  Patient has good range of motion and strength of the index finger.  Wounds appear to be healing well.  Neurological:     General: No focal deficit present.     Mental Status: She is alert and oriented to person, place, and time.  Psychiatric:        Mood and Affect: Mood normal.        Behavior: Behavior normal.        Thought Content: Thought content normal.        Judgment: Judgment normal.     Assessment & Plan:   1. Cellulitis of left index finger Left index finger cellulitis is improving however she is out of Augmentin.  We will continue antibiotics  twice daily for next 2 weeks.  She does have upcoming appointment with surgeon/hand specialist for continued management. - amoxicillin-clavulanate (AUGMENTIN) 875-125 MG tablet; Take 1 tablet by mouth 2 (two) times daily. Continue to take previous prescription of Amoxicillin to finish 3 weeks of antibiotics  Dispense: 28 tablet; Refill: 0  2. Prediabetes During hospitalization, patient was blood sugars did run elevated.  Check of hemoglobin A1c today is 5.7.  Encouraged her to limit intake of foods high in carbohydrates and sugar.  Recheck hemoglobin A1c in three months  - POCT glycosylated hemoglobin (Hb A1C)  3. Depression, unspecified depression type Well-managed with current medications.  She should continue to follow-up with psychiatric provider as  4. Encounter to establish care Appointment today to establish new primary care provider      Problem List Items Addressed This Visit       Other   Depression   Cellulitis of left index finger - Primary   Relevant Medications   amoxicillin-clavulanate (AUGMENTIN) 875-125 MG tablet   Prediabetes   Relevant Orders   POCT glycosylated hemoglobin (Hb A1C) (Completed)   Other Visit Diagnoses     Encounter to establish care           Return in about 4 weeks (around 08/21/2021) for health maintenance exam, FBW a week prior to visit. check reproductive hormones .   Ronnell Freshwater, NP

## 2021-07-28 DIAGNOSIS — L03012 Cellulitis of left finger: Secondary | ICD-10-CM | POA: Insufficient documentation

## 2021-07-28 DIAGNOSIS — R7303 Prediabetes: Secondary | ICD-10-CM | POA: Insufficient documentation

## 2021-08-06 ENCOUNTER — Encounter: Payer: Self-pay | Admitting: Infectious Diseases

## 2021-08-06 ENCOUNTER — Ambulatory Visit (INDEPENDENT_AMBULATORY_CARE_PROVIDER_SITE_OTHER): Payer: BC Managed Care – PPO | Admitting: Infectious Diseases

## 2021-08-06 ENCOUNTER — Other Ambulatory Visit: Payer: Self-pay

## 2021-08-06 VITALS — BP 129/88 | HR 111 | Temp 98.3°F | Wt 227.0 lb

## 2021-08-06 DIAGNOSIS — Z5181 Encounter for therapeutic drug level monitoring: Secondary | ICD-10-CM

## 2021-08-06 DIAGNOSIS — M659 Synovitis and tenosynovitis, unspecified: Secondary | ICD-10-CM | POA: Diagnosis not present

## 2021-08-06 DIAGNOSIS — L03012 Cellulitis of left finger: Secondary | ICD-10-CM | POA: Diagnosis not present

## 2021-08-06 NOTE — Progress Notes (Signed)
Patient Active Problem List   Diagnosis Date Noted   Cellulitis of left index finger 07/28/2021   Prediabetes 07/28/2021   Tenosynovitis of finger 07/15/2021   Depression 07/15/2021   Attention deficit disorder (ADD) in adult 07/15/2021    Patient's Medications  New Prescriptions   No medications on file  Previous Medications   ACETAMINOPHEN (TYLENOL) 325 MG TABLET    Take 2 tablets (650 mg total) by mouth 4 (four) times daily.   AMOXICILLIN-CLAVULANATE (AUGMENTIN) 875-125 MG TABLET    Take 1 tablet by mouth 2 (two) times daily. Continue to take previous prescription of Amoxicillin to finish 3 weeks of antibiotics   ASPIRIN-ACETAMINOPHEN-CAFFEINE (EXCEDRIN MIGRAINE PO)    Take 1-2 tablets by mouth 2 (two) times daily as needed (migraine).   BACITRACIN OINTMENT    Apply topically 4 (four) times daily.   DEXTROAMPHETAMINE (DEXTROSTAT) 10 MG TABLET    Take 10 mg by mouth daily.   DULOXETINE (CYMBALTA) 30 MG CAPSULE    Take 30 mg by mouth daily. Along with 60 mg   DULOXETINE (CYMBALTA) 60 MG CAPSULE    Take 60 mg by mouth daily. Along with 30 mg   KETOROLAC (TORADOL) 10 MG TABLET    Take 1 tablet (10 mg total) by mouth every 8 (eight) hours as needed (migraines).  Modified Medications   No medications on file  Discontinued Medications   No medications on file    Subjective: Here for HFU for left forefinger cellulitis and tenosynovitis 2/2 dog bite. S/p I and D on 5/16. She was in IV Unasyn in the hospital and was later switched to PO augmentin on discharge date 5/18. She has been taking augmentin twice a day. Denies missing doses. Denies any nausea, vomiting and diarrhea. Seen by PCP 5/25 where wound was healing well, PO augmentin refilled. Tells me she saw surgeon last week and stitches were removed. She has 4 more days of pill remaining. Denies any pain/tenderness/drainage from the wound. Denies fevers and chills. Wound has significantly healed. No concerns otherwise.   Review  of Systems: all systems reviewed with pertinent positives and negatives as listed above  Past Medical History:  Diagnosis Date   Anemia    AT AGE 2 AFTER GIVING BLOOD   Anxiety    Attention deficit disorder (ADD) in adult    Complication of anesthesia    WAKES UP FIGHTING   Depression    Family history of adverse reaction to anesthesia    NEPHEW WAKES UP FIGHTING DUE TO VERSED   Headache    Pre-diabetes    Sleep apnea    CPAP   Past Surgical History:  Procedure Laterality Date   ABDOMINAL HYSTERECTOMY     2006   ACHILLES TENDON SURGERY Left 10/22/2017   Procedure: ACHILLES TENDON REPAIR-SECONDARY;  Surgeon: [name], DPM;  Location: [facility];  Service: Podiatry;  Laterality: Left;   arm surgery move nerve  Right    [place]   COLONOSCOPY WITH PROPOFOL N/A 08/16/2017   Procedure: COLONOSCOPY WITH PROPOFOL;  Surgeon: Lin Landsman, MD;  Location: [facility];  Service: Gastroenterology;  Laterality: N/A;   HALLUX VALGUS LAPIDUS Left 10/22/2017   Procedure: HALLUX VALGUS LAPIDUS;  Surgeon: [name], DPM;  Location: [facility];  Service: Podiatry;  Laterality: Left;   I & D EXTREMITY Left 07/15/2021   Procedure: IRRIGATION AND DEBRIDEMENT LEFT INDEX FINGER;  Surgeon: Lenon Curt,  Erskine Emery, MD;  Location: [place];  Service: Plastics;  Laterality: Left;   OSTECTOMY Left 10/22/2017   Procedure: OSTECTOMY-HAGLUNDS/RECTROCALCANEAL;  Surgeon: [name], DPM;  Location: [facility];  Service: Podiatry;  Laterality: Left;    Social History   Tobacco Use   Smoking status: Never   Smokeless tobacco: Never  Vaping Use   Vaping Use: Never used  Substance Use Topics   Alcohol use: Yes    Comment: RARE 1-2 YEARLY   Drug use: Never    Family History  Problem Relation Age of Onset   Cancer Mother    Ovarian cancer Mother 69       not sure ovarian or uterine    Heart attack Maternal Grandmother    Cancer Other     Allergies   Allergen Reactions   Codeine Other (See Comments)    Vomiting    Health Maintenance  Topic Date Due   COVID-19 Vaccine (1) Never done   Hepatitis C Screening  Never done   PAP SMEAR-Modifier  Never done   Zoster Vaccines- Shingrix (1 of 2) Never done   MAMMOGRAM  07/17/2019   INFLUENZA VACCINE  09/30/2021   COLONOSCOPY (Pts 45-20yr Insurance coverage will need to be confirmed)  08/17/2027   TETANUS/TDAP  07/13/2031   HIV Screening  Completed   HPV VACCINES  Aged Out    Objective: BP 129/88   Pulse (!) 111   Temp 98.3 F (36.8 C) (Oral)   Wt 227 lb (103 kg)   BMI 36.48 kg/m   Physical Exam Constitutional:      Appearance: Normal appearance. Obese  HENT:     Head: Normocephalic and atraumatic.      Mouth: Mucous membranes are moist.  Eyes:    Conjunctiva/sclera: Conjunctivae normal.     Pupils:   Cardiovascular:     Rate and Rhythm: Normal rate and regular rhythm.     Heart sounds:  Pulmonary:     Effort: Pulmonary effort is normal.     Breath sounds: Normal breath sounds.   Abdominal:     General: Non distended     Palpations: soft.   Musculoskeletal:        General: Normal range of motion.   Skin:    General: Skin is warm and dry.     Comments:    Neurological:     General: grossly non focal     Mental Status: awake, alert and oriented to person, place, and time.   Psychiatric:        Mood and Affect: Mood normal.   Lab Results Lab Results  Component Value Date   WBC 9.0 07/17/2021   HGB 11.5 (L) 07/17/2021   HCT 35.5 (L) 07/17/2021   MCV 93.2 07/17/2021   PLT 210 07/17/2021    Lab Results  Component Value Date   CREATININE 1.03 (H) 07/17/2021   BUN 20 07/17/2021   NA 142 07/17/2021   K 3.8 07/17/2021   CL 111 07/17/2021   CO2 26 07/17/2021    Lab Results  Component Value Date   ALT 18 07/17/2021   AST 14 (L) 07/17/2021   ALKPHOS 41 07/17/2021   BILITOT 0.3 07/17/2021    No results found for: CHOL, HDL, LDLCALC, LDLDIRECT,  TRIG, CHOLHDL No results found for: LABRPR, RPRTITER No results found for: HIV1RNAQUANT, HIV1RNAVL, CD4TABS   Problem List Items Addressed This Visit       Musculoskeletal and Integument   Tenosynovitis of finger - Primary  Other   Cellulitis of left index finger   Medication monitoring encounter   Assessment/Plan Left Forefinger cellulitis/tenosynovitis in the setting of dig bite - significantly improved clinically Complete 4 more days of augmentin  Fu as needed  Medication monitoring  Patient wants to do labs with PCP in next few weeks  I have personally spent 40  minutes involved in face-to-face and non-face-to-face activities for this patient on the day of the visit including counseling of the patient and coordination of care.   Wilber Oliphant, [facility] 08/06/2021, 1:42 PM

## 2021-08-07 DIAGNOSIS — F900 Attention-deficit hyperactivity disorder, predominantly inattentive type: Secondary | ICD-10-CM | POA: Diagnosis not present

## 2021-08-07 DIAGNOSIS — F331 Major depressive disorder, recurrent, moderate: Secondary | ICD-10-CM | POA: Diagnosis not present

## 2021-08-12 DIAGNOSIS — Z86711 Personal history of pulmonary embolism: Secondary | ICD-10-CM | POA: Diagnosis not present

## 2021-08-12 DIAGNOSIS — Z7984 Long term (current) use of oral hypoglycemic drugs: Secondary | ICD-10-CM | POA: Diagnosis not present

## 2021-08-12 DIAGNOSIS — Z86718 Personal history of other venous thrombosis and embolism: Secondary | ICD-10-CM | POA: Diagnosis not present

## 2021-08-12 DIAGNOSIS — Z79899 Other long term (current) drug therapy: Secondary | ICD-10-CM | POA: Diagnosis not present

## 2021-08-12 DIAGNOSIS — E119 Type 2 diabetes mellitus without complications: Secondary | ICD-10-CM | POA: Diagnosis not present

## 2021-08-12 DIAGNOSIS — D6851 Activated protein C resistance: Secondary | ICD-10-CM | POA: Diagnosis not present

## 2021-08-12 DIAGNOSIS — F32A Depression, unspecified: Secondary | ICD-10-CM | POA: Diagnosis not present

## 2021-08-12 DIAGNOSIS — F17210 Nicotine dependence, cigarettes, uncomplicated: Secondary | ICD-10-CM | POA: Diagnosis not present

## 2021-08-12 DIAGNOSIS — E063 Autoimmune thyroiditis: Secondary | ICD-10-CM | POA: Diagnosis not present

## 2021-08-12 DIAGNOSIS — I10 Essential (primary) hypertension: Secondary | ICD-10-CM | POA: Diagnosis not present

## 2021-08-12 DIAGNOSIS — Z7901 Long term (current) use of anticoagulants: Secondary | ICD-10-CM | POA: Diagnosis not present

## 2021-08-12 DIAGNOSIS — M5136 Other intervertebral disc degeneration, lumbar region: Secondary | ICD-10-CM | POA: Diagnosis not present

## 2021-08-15 ENCOUNTER — Other Ambulatory Visit: Payer: BC Managed Care – PPO

## 2021-08-22 ENCOUNTER — Ambulatory Visit: Payer: BC Managed Care – PPO | Admitting: Nurse Practitioner

## 2021-08-28 ENCOUNTER — Other Ambulatory Visit: Payer: Self-pay

## 2021-08-28 DIAGNOSIS — Z0000 Encounter for general adult medical examination without abnormal findings: Secondary | ICD-10-CM

## 2021-08-28 DIAGNOSIS — Z130 Encounter for screening for diseases of the blood and blood-forming organs and certain disorders involving the immune mechanism: Secondary | ICD-10-CM

## 2021-08-29 ENCOUNTER — Other Ambulatory Visit: Payer: BC Managed Care – PPO

## 2021-08-29 DIAGNOSIS — Z130 Encounter for screening for diseases of the blood and blood-forming organs and certain disorders involving the immune mechanism: Secondary | ICD-10-CM

## 2021-08-29 DIAGNOSIS — Z0000 Encounter for general adult medical examination without abnormal findings: Secondary | ICD-10-CM | POA: Diagnosis not present

## 2021-08-29 DIAGNOSIS — I824Z2 Acute embolism and thrombosis of unspecified deep veins of left distal lower extremity: Secondary | ICD-10-CM | POA: Diagnosis not present

## 2021-08-29 DIAGNOSIS — Z13228 Encounter for screening for other metabolic disorders: Secondary | ICD-10-CM | POA: Diagnosis not present

## 2021-08-29 DIAGNOSIS — Z1321 Encounter for screening for nutritional disorder: Secondary | ICD-10-CM | POA: Diagnosis not present

## 2021-08-29 DIAGNOSIS — Z1329 Encounter for screening for other suspected endocrine disorder: Secondary | ICD-10-CM | POA: Diagnosis not present

## 2021-08-30 LAB — CBC
Hematocrit: 43.6 % (ref 34.0–46.6)
Hemoglobin: 14.3 g/dL (ref 11.1–15.9)
MCH: 30.4 pg (ref 26.6–33.0)
MCHC: 32.8 g/dL (ref 31.5–35.7)
MCV: 93 fL (ref 79–97)
Platelets: 208 10*3/uL (ref 150–450)
RBC: 4.7 x10E6/uL (ref 3.77–5.28)
RDW: 12.2 % (ref 11.7–15.4)
WBC: 4.7 10*3/uL (ref 3.4–10.8)

## 2021-08-30 LAB — COMPREHENSIVE METABOLIC PANEL WITH GFR
ALT: 17 IU/L (ref 0–32)
AST: 14 IU/L (ref 0–40)
Albumin/Globulin Ratio: 1.8 (ref 1.2–2.2)
Albumin: 4.3 g/dL (ref 3.8–4.9)
Alkaline Phosphatase: 70 IU/L (ref 44–121)
BUN/Creatinine Ratio: 20 (ref 9–23)
BUN: 19 mg/dL (ref 6–24)
Bilirubin Total: 0.3 mg/dL (ref 0.0–1.2)
CO2: 21 mmol/L (ref 20–29)
Calcium: 9.4 mg/dL (ref 8.7–10.2)
Chloride: 106 mmol/L (ref 96–106)
Creatinine, Ser: 0.94 mg/dL (ref 0.57–1.00)
Globulin, Total: 2.4 g/dL (ref 1.5–4.5)
Glucose: 96 mg/dL (ref 70–99)
Potassium: 4.3 mmol/L (ref 3.5–5.2)
Sodium: 144 mmol/L (ref 134–144)
Total Protein: 6.7 g/dL (ref 6.0–8.5)
eGFR: 70 mL/min/1.73

## 2021-08-30 LAB — HEMOGLOBIN A1C
Est. average glucose Bld gHb Est-mCnc: 123 mg/dL
Hgb A1c MFr Bld: 5.9 % — ABNORMAL HIGH (ref 4.8–5.6)

## 2021-08-30 LAB — LIPID PANEL
Chol/HDL Ratio: 2.8 ratio (ref 0.0–4.4)
Cholesterol, Total: 189 mg/dL (ref 100–199)
HDL: 68 mg/dL (ref >39)
LDL Chol Calc (NIH): 104 mg/dL — ABNORMAL HIGH (ref 0–99)
Triglycerides: 96 mg/dL (ref 0–149)
VLDL Cholesterol Cal: 17 mg/dL (ref 5–40)

## 2021-08-30 LAB — TSH: TSH: 2.91 u[IU]/mL (ref 0.450–4.500)

## 2021-08-31 NOTE — Progress Notes (Signed)
Labs look good. Discuss with patient at visit 09/03/2021

## 2021-09-02 NOTE — Progress Notes (Signed)
Complete physical exam   Patient: Becky Lutz   DOB: 02-22-1966   56 y.o. Female  MRN: [id number] Visit Date: 09/03/2021    Chief Complaint  Patient presents with   Annual Exam   Subjective    Becky Lutz is a 56 y.o. female who presents today for a complete physical exam.  She reports consuming a generally healthy diet. The patient has a physically strenuous job, but has no regular exercise apart from work.  She generally feels fairly well. She does have additional problems to discuss today.   HPI  Annual physical  -had been seeing infectious disease due to cellulitis of the finger after dog bite.  -prediabetes  --HgbA1c prior to this visit 5.9  -lab sone just prior to this visit.  --mild elevation of LDL with remainder of lipid panel normal.  --normal labs otherwise.  -sees psychotherapy for depression/anxiety  -did have cellulitis of the left index finger after being bit by a dog. She does still have some swelling and scar tissue present. 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  -patient states that she is having some difficulty with sleep. States that she just has a million thoughts running through her head all the time. She does have anxiety and depression. Does see therapist/psychiatrist for management.  -has spot on right elbow which is getting larger and rough in texture. Will sometimes itch and can bleed if she scratches it.  -trouble with menopause. Has been having hot flashes for at least 10 years.    Past Medical History:  Diagnosis Date   Anemia    AT AGE 17 AFTER GIVING BLOOD   Anxiety    Attention deficit disorder (ADD) in adult    Complication of anesthesia    WAKES UP FIGHTING   Depression    Family history of adverse reaction to anesthesia    NEPHEW WAKES UP FIGHTING DUE TO VERSED   Headache    Pre-diabetes    Sleep apnea    CPAP   Past Surgical History:  Procedure Laterality Date   ABDOMINAL  HYSTERECTOMY     2006   ACHILLES TENDON SURGERY Left 10/22/2017   Procedure: ACHILLES TENDON REPAIR-SECONDARY;  Surgeon: [name], DPM;  Location: [facility];  Service: Podiatry;  Laterality: Left;   arm surgery move nerve  Right    [place]   COLONOSCOPY WITH PROPOFOL N/A 08/16/2017   Procedure: COLONOSCOPY WITH PROPOFOL;  Surgeon: Lin Landsman, MD;  Location: [facility];  Service: Gastroenterology;  Laterality: N/A;   HALLUX VALGUS LAPIDUS Left 10/22/2017   Procedure: HALLUX VALGUS LAPIDUS;  Surgeon: [name], DPM;  Location: [facility];  Service: Podiatry;  Laterality: Left;   I & D EXTREMITY Left 07/15/2021   Procedure: IRRIGATION AND DEBRIDEMENT LEFT INDEX FINGER;  Surgeon: Dayna Barker, MD;  Location: [place];  Service: Plastics;  Laterality: Left;   OSTECTOMY Left 10/22/2017   Procedure: OSTECTOMY-HAGLUNDS/RECTROCALCANEAL;  Surgeon: [name], DPM;  Location: [facility];  Service: Podiatry;  Laterality: Left;   Social History   Socioeconomic History   Marital status: Married    Spouse name: Not on file   Number of children: Not on file   Years of education: Not on file   Highest education level: Not on file  Occupational History   Not on file  Tobacco Use   Smoking status: Never   Smokeless  tobacco: Never  Vaping Use   Vaping Use: Never used  Substance and Sexual Activity   Alcohol use: Yes    Comment: RARE 1-2 YEARLY   Drug use: Never   Sexual activity: Not Currently  Other Topics Concern   Not on file  Social History Narrative   Not on file   Social Determinants of Health   Financial Resource Strain: Not on file  Food Insecurity: Not on file  Transportation Needs: Not on file  Physical Activity: Not on file  Stress: Not on file  Social Connections: Not on file  Intimate Partner Violence: Not on file   Family Status  Relation Name Status   Mother  Deceased   MGM  (Not Specified)   Other  (Not  Specified)   Family History  Problem Relation Age of Onset   Cancer Mother    Ovarian cancer Mother 46       not sure ovarian or uterine    Heart attack Maternal Grandmother    Cancer Other    Allergies  Allergen Reactions   Codeine Other (See Comments)    Vomiting    Patient Care Team: Ronnell Freshwater, NP as PCP - General (Family Medicine)   Medications: Outpatient Medications Prior to Visit  Medication Sig   Aspirin-Acetaminophen-Caffeine (EXCEDRIN MIGRAINE PO) Take 1-2 tablets by mouth 2 (two) times daily as needed (migraine).   bacitracin ointment Apply topically 4 (four) times daily.   dextroamphetamine (DEXTROSTAT) 10 MG tablet Take 10 mg by mouth daily.   DULoxetine (CYMBALTA) 60 MG capsule Take 60 mg by mouth daily. Along with 30 mg   influenza vac split quadrivalent PF (FLUARIX) 0.5 ML injection inject 0.5 milliliter intramuscularly   ketorolac (TORADOL) 10 MG tablet Take 1 tablet (10 mg total) by mouth every 8 (eight) hours as needed (migraines).   promethazine (PHENERGAN) 12.5 MG tablet    tapentadol (NUCYNTA) 50 MG tablet    traMADol (ULTRAM) 50 MG tablet    [DISCONTINUED] acetaminophen (TYLENOL) 325 MG tablet Take 2 tablets (650 mg total) by mouth 4 (four) times daily.   [DISCONTINUED] amoxicillin-clavulanate (AUGMENTIN) 875-125 MG tablet Take 1 tablet by mouth 2 (two) times daily. Continue to take previous prescription of Amoxicillin to finish 3 weeks of antibiotics   [DISCONTINUED] DULoxetine (CYMBALTA) 30 MG capsule Take 30 mg by mouth daily. Along with 60 mg   No facility-administered medications prior to visit.    Review of Systems  Constitutional:  Positive for fatigue. Negative for activity change, appetite change, chills and fever.       The patient states that she is having trouble with sleep due tin situational and family stress.   HENT:  Negative for congestion, postnasal drip, rhinorrhea, sinus pressure, sinus pain, sneezing and sore throat.   Eyes:  Negative.   Respiratory:  Negative for cough, chest tightness, shortness of breath and wheezing.   Cardiovascular:  Negative for chest pain and palpitations.  Gastrointestinal:  Negative for abdominal pain, constipation, diarrhea, nausea and vomiting.  Endocrine: Positive for heat intolerance. Negative for cold intolerance, polydipsia and polyuria.       Mildly elevated blood sugar   Genitourinary:  Negative for dyspareunia, dysuria, flank pain, frequency and urgency.  Musculoskeletal:  Negative for arthralgias, back pain and myalgias.  Skin:  Negative for rash.       Cellulitis of left index finger has nearly resolved. Some scar tissue present.   Allergic/Immunologic: Negative for environmental allergies.  Neurological:  Positive for  headaches. Negative for dizziness and weakness.       Migraines - takes toradol as needed for acute migraine relief.   Hematological:  Negative for adenopathy.  Psychiatric/Behavioral:  Positive for decreased concentration and sleep disturbance. The patient is nervous/anxious.     Last CBC Lab Results  Component Value Date   WBC 4.7 08/29/2021   HGB 14.3 08/29/2021   HCT 43.6 08/29/2021   MCV 93 08/29/2021   MCH 30.4 08/29/2021   RDW 12.2 08/29/2021   PLT 208 52/77/8242   Last metabolic panel Lab Results  Component Value Date   GLUCOSE 96 08/29/2021   NA 144 08/29/2021   K 4.3 08/29/2021   CL 106 08/29/2021   CO2 21 08/29/2021   BUN 19 08/29/2021   CREATININE 0.94 08/29/2021   EGFR 70 08/29/2021   CALCIUM 9.4 08/29/2021   PROT 6.7 08/29/2021   ALBUMIN 4.3 08/29/2021   LABGLOB 2.4 08/29/2021   AGRATIO 1.8 08/29/2021   BILITOT 0.3 08/29/2021   ALKPHOS 70 08/29/2021   AST 14 08/29/2021   ALT 17 08/29/2021   ANIONGAP 5 07/17/2021   Last lipids Lab Results  Component Value Date   CHOL 189 08/29/2021   HDL 68 08/29/2021   LDLCALC 104 (H) 08/29/2021   TRIG 96 08/29/2021   CHOLHDL 2.8 08/29/2021   Last hemoglobin A1c Lab Results   Component Value Date   HGBA1C 5.9 (H) 08/29/2021   Last thyroid functions Lab Results  Component Value Date   TSH 2.910 08/29/2021        Objective     Today's Vitals   09/03/21 1408  BP: 112/78  Pulse: (!) 101  SpO2: 97%  Weight: 227 lb (103 kg)  Height: 5' 6.14" (1.68 m)   Body mass index is 36.48 kg/m.   BP Readings from Last 3 Encounters:  09/03/21 112/78  08/06/21 129/88  07/24/21 117/83    Wt Readings from Last 3 Encounters:  09/03/21 227 lb (103 kg)  08/06/21 227 lb (103 kg)  07/24/21 227 lb 12.8 oz (103.3 kg)     Physical Exam Vitals and nursing note reviewed.  Constitutional:      Appearance: Normal appearance. She is well-developed. She is obese.  HENT:     Head: Normocephalic and atraumatic.  Eyes:     Pupils: Pupils are equal, round, and reactive to light.  Cardiovascular:     Rate and Rhythm: Normal rate and regular rhythm.     Pulses: Normal pulses.     Heart sounds: Normal heart sounds.  Pulmonary:     Effort: Pulmonary effort is normal.     Breath sounds: Normal breath sounds.  Abdominal:     Palpations: Abdomen is soft.  Musculoskeletal:        General: Normal range of motion.     Cervical back: Normal range of motion and neck supple.  Lymphadenopathy:     Cervical: No cervical adenopathy.  Skin:    General: Skin is warm and dry.     Capillary Refill: Capillary refill takes less than 2 seconds.  Neurological:     General: No focal deficit present.     Mental Status: She is alert and oriented to person, place, and time.  Psychiatric:        Mood and Affect: Mood normal.        Behavior: Behavior normal.        Thought Content: Thought content normal.        Judgment: Judgment  normal.     Last depression screening scores    09/03/2021    2:12 PM 07/24/2021    1:53 PM  PHQ 2/9 Scores  PHQ - 2 Score 3 2  PHQ- 9 Score 10 9      Assessment & Plan    1. Encounter for general adult medical examination with abnormal  findings Annual physical today.  2. Prediabetes Most recent hemoglobin A1c 5.9.  Advised patient to limit intake of carbohydrates and sugar and increase intake of water.  Recheck hemoglobin A1c in 3 months.  3. Hyperlipidemia LDL goal <100 Recommend patient limit intake of fried and fatty foods. She should increase intake of lean proteins and green leafy vegetables. Adding exercise into daily routine will also be beneficial.    4. Neoplasm of uncertain behavior of skin of upper arm Refer to dermatology for further evaluation. - Ambulatory referral to Dermatology  5. Encounter for screening mammogram for malignant neoplasm of breast Order for screening mammogram requested. - MM 3D SCREEN BREAST BILATERAL; Future    Immunization History  Administered Date(s) Administered   Influenza,inj,Quad PF,6+ Mos 12/19/2016   Moderna Sars-Covid-2 Vaccination 05/24/2019, 06/27/2019   Tdap 07/12/2021    Health Maintenance  Topic Date Due   Zoster Vaccines- Shingrix (1 of 2) Never done   PAP SMEAR-Modifier  Never done   MAMMOGRAM  07/17/2019   COVID-19 Vaccine (3 - Moderna risk series) 09/19/2021 (Originally 07/25/2019)   Hepatitis C Screening  09/04/2022 (Originally 08/21/1981)   INFLUENZA VACCINE  09/30/2021   COLONOSCOPY (Pts 45-43yr Insurance coverage will need to be confirmed)  08/17/2027   TETANUS/TDAP  07/13/2031   HIV Screening  Completed   HPV VACCINES  Aged Out    Discussed health benefits of physical activity, and encouraged her to engage in regular exercise appropriate for her age and condition.  Problem List Items Addressed This Visit       Musculoskeletal and Integument   Neoplasm of uncertain behavior of skin of upper arm   Relevant Orders   Ambulatory referral to Dermatology     Other   Prediabetes   Hyperlipidemia LDL goal <100   Other Visit Diagnoses     Encounter for general adult medical examination with abnormal findings    -  Primary   Encounter for  screening mammogram for malignant neoplasm of breast       Relevant Orders   MM 3D SCREEN BREAST BILATERAL        Return in about 4 months (around 01/04/2022) for prediabetes - check HgbA1c .        HRonnell Freshwater NP  [phone](phone) [phone](fax)  [place]

## 2021-09-03 ENCOUNTER — Ambulatory Visit (INDEPENDENT_AMBULATORY_CARE_PROVIDER_SITE_OTHER): Payer: BC Managed Care – PPO | Admitting: Nurse Practitioner

## 2021-09-03 ENCOUNTER — Encounter: Payer: Self-pay | Admitting: Nurse Practitioner

## 2021-09-03 VITALS — BP 112/78 | HR 101 | Ht 66.14 in | Wt 227.0 lb

## 2021-09-03 DIAGNOSIS — R7303 Prediabetes: Secondary | ICD-10-CM

## 2021-09-03 DIAGNOSIS — Z0001 Encounter for general adult medical examination with abnormal findings: Secondary | ICD-10-CM

## 2021-09-03 DIAGNOSIS — E785 Hyperlipidemia, unspecified: Secondary | ICD-10-CM | POA: Diagnosis not present

## 2021-09-03 DIAGNOSIS — D485 Neoplasm of uncertain behavior of skin: Secondary | ICD-10-CM

## 2021-09-03 DIAGNOSIS — Z1231 Encounter for screening mammogram for malignant neoplasm of breast: Secondary | ICD-10-CM

## 2021-09-14 DIAGNOSIS — E785 Hyperlipidemia, unspecified: Secondary | ICD-10-CM | POA: Insufficient documentation

## 2021-09-14 DIAGNOSIS — D485 Neoplasm of uncertain behavior of skin: Secondary | ICD-10-CM | POA: Insufficient documentation

## 2021-10-20 DIAGNOSIS — E039 Hypothyroidism, unspecified: Secondary | ICD-10-CM | POA: Diagnosis not present

## 2021-10-20 DIAGNOSIS — I10 Essential (primary) hypertension: Secondary | ICD-10-CM | POA: Diagnosis not present

## 2021-10-20 DIAGNOSIS — I825Y2 Chronic embolism and thrombosis of unspecified deep veins of left proximal lower extremity: Secondary | ICD-10-CM | POA: Diagnosis not present

## 2021-10-20 DIAGNOSIS — Z0000 Encounter for general adult medical examination without abnormal findings: Secondary | ICD-10-CM | POA: Diagnosis not present

## 2021-10-24 DIAGNOSIS — F331 Major depressive disorder, recurrent, moderate: Secondary | ICD-10-CM | POA: Diagnosis not present

## 2021-10-24 DIAGNOSIS — F900 Attention-deficit hyperactivity disorder, predominantly inattentive type: Secondary | ICD-10-CM | POA: Diagnosis not present

## 2021-10-24 DIAGNOSIS — Z79899 Other long term (current) drug therapy: Secondary | ICD-10-CM | POA: Diagnosis not present

## 2021-10-24 DIAGNOSIS — Z1389 Encounter for screening for other disorder: Secondary | ICD-10-CM | POA: Diagnosis not present

## 2021-12-22 DIAGNOSIS — E039 Hypothyroidism, unspecified: Secondary | ICD-10-CM | POA: Diagnosis not present

## 2021-12-22 DIAGNOSIS — E1122 Type 2 diabetes mellitus with diabetic chronic kidney disease: Secondary | ICD-10-CM | POA: Diagnosis not present

## 2021-12-22 DIAGNOSIS — G588 Other specified mononeuropathies: Secondary | ICD-10-CM | POA: Diagnosis not present

## 2021-12-22 DIAGNOSIS — G2581 Restless legs syndrome: Secondary | ICD-10-CM | POA: Diagnosis not present

## 2022-01-06 ENCOUNTER — Ambulatory Visit: Payer: BC Managed Care – PPO | Admitting: Nurse Practitioner

## 2022-01-15 ENCOUNTER — Ambulatory Visit (INDEPENDENT_AMBULATORY_CARE_PROVIDER_SITE_OTHER): Payer: BC Managed Care – PPO | Admitting: Nurse Practitioner

## 2022-01-15 ENCOUNTER — Encounter: Payer: Self-pay | Admitting: Nurse Practitioner

## 2022-01-15 VITALS — BP 119/82 | HR 99 | Ht 66.14 in | Wt 228.4 lb

## 2022-01-15 DIAGNOSIS — F411 Generalized anxiety disorder: Secondary | ICD-10-CM | POA: Diagnosis not present

## 2022-01-15 DIAGNOSIS — R7303 Prediabetes: Secondary | ICD-10-CM

## 2022-01-15 DIAGNOSIS — Z6836 Body mass index (BMI) 36.0-36.9, adult: Secondary | ICD-10-CM

## 2022-01-15 LAB — POCT GLYCOSYLATED HEMOGLOBIN (HGB A1C): HbA1c POC (<> result, manual entry): 5.8 % (ref 4.0–5.6)

## 2022-01-15 NOTE — Progress Notes (Signed)
Established patient visit   Patient: Becky Lutz   DOB: 1965/06/06   56 y.o. Female  MRN: [id number] Visit Date: 01/15/2022   Chief Complaint  Patient presents with   Follow-up   Subjective    HPI  Follow up  -prediabetes.  -HgbA1c has improved from 5.9 to 5.8. -does have increased work related stress. Has been told she is losing her job. She is awaiting an offer from new job.  --she states that her depression screening was worse than usual because of this work stress.    Medications: Outpatient Medications Prior to Visit  Medication Sig   Aspirin-Acetaminophen-Caffeine (EXCEDRIN MIGRAINE PO) Take 1-2 tablets by mouth 2 (two) times daily as needed (migraine).   dextroamphetamine (DEXTROSTAT) 10 MG tablet Take 10 mg by mouth daily.   DULoxetine (CYMBALTA) 60 MG capsule Take 60 mg by mouth daily. Along with 30 mg   ketorolac (TORADOL) 10 MG tablet Take 1 tablet (10 mg total) by mouth every 8 (eight) hours as needed (migraines).   [DISCONTINUED] bacitracin ointment Apply topically 4 (four) times daily.   [DISCONTINUED] influenza vac split quadrivalent PF (FLUARIX) 0.5 ML injection inject 0.5 milliliter intramuscularly   [DISCONTINUED] promethazine (PHENERGAN) 12.5 MG tablet    [DISCONTINUED] tapentadol (NUCYNTA) 50 MG tablet    [DISCONTINUED] traMADol (ULTRAM) 50 MG tablet    No facility-administered medications prior to visit.    Review of Systems  Constitutional:  Negative for activity change, appetite change, chills, fatigue and fever.  HENT:  Negative for congestion, postnasal drip, rhinorrhea, sinus pressure, sinus pain, sneezing and sore throat.   Eyes: Negative.   Respiratory:  Negative for cough, chest tightness, shortness of breath and wheezing.   Cardiovascular:  Negative for chest pain and palpitations.  Gastrointestinal:  Negative for abdominal pain, constipation, diarrhea, nausea and vomiting.  Endocrine: Negative for cold intolerance, heat intolerance,  polydipsia and polyuria.       Blood sugars doing well    Genitourinary:  Negative for dyspareunia, dysuria, flank pain, frequency and urgency.  Musculoskeletal:  Negative for arthralgias, back pain and myalgias.  Skin:  Negative for rash.  Allergic/Immunologic: Negative for environmental allergies.  Neurological:  Negative for dizziness, weakness and headaches.  Hematological:  Negative for adenopathy.  Psychiatric/Behavioral:  Positive for dysphoric mood. The patient is nervous/anxious.        Objective     Today's Vitals   01/15/22 1120  BP: 119/82  Pulse: 99  SpO2: 98%  Weight: 228 lb 6.4 oz (103.6 kg)  Height: 5' 6.14" (1.68 m)   Body mass index is 36.71 kg/m.  BP Readings from Last 3 Encounters:  01/15/22 119/82  09/03/21 112/78  08/06/21 129/88    Wt Readings from Last 3 Encounters:  01/15/22 228 lb 6.4 oz (103.6 kg)  09/03/21 227 lb (103 kg)  08/06/21 227 lb (103 kg)    Physical Exam Vitals and nursing note reviewed.  Constitutional:      Appearance: Normal appearance. She is well-developed.  HENT:     Head: Normocephalic and atraumatic.  Eyes:     Pupils: Pupils are equal, round, and reactive to light.  Cardiovascular:     Rate and Rhythm: Normal rate and regular rhythm.     Pulses: Normal pulses.     Heart sounds: Normal heart sounds.  Pulmonary:     Effort: Pulmonary effort is normal.     Breath sounds: Normal breath sounds.  Abdominal:     Palpations: Abdomen is soft.  Musculoskeletal:        General: Normal range of motion.     Cervical back: Normal range of motion and neck supple.  Lymphadenopathy:     Cervical: No cervical adenopathy.  Skin:    General: Skin is warm and dry.     Capillary Refill: Capillary refill takes less than 2 seconds.  Neurological:     General: No focal deficit present.     Mental Status: She is alert and oriented to person, place, and time.  Psychiatric:        Attention and Perception: Attention and perception  normal.        Mood and Affect: Affect normal. Mood is anxious.        Speech: Speech normal.        Behavior: Behavior normal. Behavior is cooperative.        Thought Content: Thought content normal.        Cognition and Memory: Cognition and memory normal.        Judgment: Judgment normal.      Results for orders placed or performed in visit on 01/15/22  POCT glycosylated hemoglobin (Hb A1C)  Result Value Ref Range   Hemoglobin A1C     HbA1c POC (<> result, manual entry) 5.8 4.0 - 5.6 %   HbA1c, POC (prediabetic range)     HbA1c, POC (controlled diabetic range)      Assessment & Plan    1. Prediabetes HgbA1c 5.8 today. Continue to control with diet - POCT glycosylated hemoglobin (Hb A1C)  2. Generalized anxiety disorder Worse due to work related stress. Continue cymbalta as prescribed   3. BMI 36.0-36.9,adult Discussed lowering calorie intake to 1500 calories per day and incorporating exercise into daily routine to help lose weight.  Reassess in three months     Problem List Items Addressed This Visit       Other   Prediabetes - Primary   Relevant Orders   POCT glycosylated hemoglobin (Hb A1C) (Completed)   Generalized anxiety disorder   BMI 36.0-36.9,adult     Return in about 4 months (around 05/16/2022) for prediabetes - check HgbA1c .         Ronnell Freshwater, NP  [facility] at [facility] [phone] (phone) [phone] (fax)  [place]

## 2022-01-31 DIAGNOSIS — Z6836 Body mass index (BMI) 36.0-36.9, adult: Secondary | ICD-10-CM | POA: Insufficient documentation

## 2022-01-31 DIAGNOSIS — F411 Generalized anxiety disorder: Secondary | ICD-10-CM | POA: Insufficient documentation

## 2022-02-24 DIAGNOSIS — E1122 Type 2 diabetes mellitus with diabetic chronic kidney disease: Secondary | ICD-10-CM | POA: Diagnosis not present

## 2022-02-24 DIAGNOSIS — E039 Hypothyroidism, unspecified: Secondary | ICD-10-CM | POA: Diagnosis not present

## 2022-02-24 DIAGNOSIS — I10 Essential (primary) hypertension: Secondary | ICD-10-CM | POA: Diagnosis not present

## 2022-02-24 DIAGNOSIS — G2581 Restless legs syndrome: Secondary | ICD-10-CM | POA: Diagnosis not present

## 2022-03-23 ENCOUNTER — Ambulatory Visit (INDEPENDENT_AMBULATORY_CARE_PROVIDER_SITE_OTHER): Payer: BC Managed Care – PPO | Admitting: Dermatology

## 2022-03-23 VITALS — BP 111/70 | HR 90

## 2022-03-23 DIAGNOSIS — D1800 Hemangioma unspecified site: Secondary | ICD-10-CM | POA: Diagnosis not present

## 2022-03-23 DIAGNOSIS — L821 Other seborrheic keratosis: Secondary | ICD-10-CM | POA: Diagnosis not present

## 2022-03-23 DIAGNOSIS — L820 Inflamed seborrheic keratosis: Secondary | ICD-10-CM

## 2022-03-23 NOTE — Progress Notes (Signed)
   New Patient Visit  Subjective  Becky Lutz is a 57 y.o. female who presents for the following: New Patient (Initial Visit) (Spot at right back of elbow that has changed and grown. Patient reports noticed last 3 years. Occasionally picks at. Patient reports she has a lot of hemangiomas that is concerned left breast she would like checked. ). The patient has spots, moles and lesions to be evaluated, some may be new or changing and the patient has concerns that these could be cancer.  The following portions of the chart were reviewed this encounter and updated as appropriate:   Tobacco  Allergies  Meds  Problems  Med Hx  Surg Hx  Fam Hx     Review of Systems:  No other skin or systemic complaints except as noted in HPI or Assessment and Plan.  Objective  Well appearing patient in no apparent distress; mood and affect are within normal limits.  A focused examination was performed including face, neck, chest and back. Relevant physical exam findings are noted in the Assessment and Plan.  right elbow x 1 Erythematous stuck-on, waxy papule or plaque       left breast 0.6 cm red papule         Assessment & Plan  Inflamed seborrheic keratosis right elbow x 1 Symptomatic, irritating, patient would like treated. Destruction of lesion - right elbow x 1 Complexity: simple   Destruction method: cryotherapy   Informed consent: discussed and consent obtained   Timeout:  patient name, date of birth, surgical site, and procedure verified Lesion destroyed using liquid nitrogen: Yes   Region frozen until ice ball extended beyond lesion: Yes   Outcome: patient tolerated procedure well with no complications   Post-procedure details: wound care instructions given   Additional details:  Prior to procedure, discussed risks of blister formation, small wound, skin dyspigmentation, or rare scar following cryotherapy. Recommend Vaseline ointment to treated areas while  healing.  Hemangioma, unspecified site left breast Benign-appearing.  Observation.  Call clinic for new or changing lesions.  Recommend daily use of broad spectrum spf 30+ sunscreen to sun-exposed areas.   Seborrheic Keratoses - Stuck-on, waxy, tan-brown papules and/or plaques  - Benign-appearing - Discussed benign etiology and prognosis. - Observe - Call for any changes  Hemangiomas - Red papules - Discussed benign nature - Observe - Call for any changes  Return in about 3 months (around 06/22/2022) for isk recheck rt elbow.  IRuthell Rummage, CMA, am acting as scribe for Sarina Ser, MD. Documentation: I have reviewed the above documentation for accuracy and completeness, and I agree with the above.  Sarina Ser, MD

## 2022-03-23 NOTE — Patient Instructions (Addendum)
Seborrheic Keratosis  What causes seborrheic keratoses? Seborrheic keratoses are harmless, common skin growths that first appear during adult life.  As time goes by, more growths appear.  Some people may develop a large number of them.  Seborrheic keratoses appear on both covered and uncovered body parts.  They are not caused by sunlight.  The tendency to develop seborrheic keratoses can be inherited.  They vary in color from skin-colored to gray, brown, or even black.  They can be either smooth or have a rough, warty surface.   Seborrheic keratoses are superficial and look as if they were stuck on the skin.  Under the microscope this type of keratosis looks like layers upon layers of skin.  That is why at times the top layer may seem to fall off, but the rest of the growth remains and re-grows.    Treatment Seborrheic keratoses do not need to be treated, but can easily be removed in the office.  Seborrheic keratoses often cause symptoms when they rub on clothing or jewelry.  Lesions can be in the way of shaving.  If they become inflamed, they can cause itching, soreness, or burning.  Removal of a seborrheic keratosis can be accomplished by freezing, burning, or surgery. If any spot bleeds, scabs, or grows rapidly, please return to have it checked, as these can be an indication of a skin cancer.  Cryotherapy Aftercare  Wash gently with soap and water everyday.   Apply Vaseline and Band-Aid daily until healed.       Melanoma ABCDEs  Melanoma is the most dangerous type of skin cancer, and is the leading cause of death from skin disease.  You are more likely to develop melanoma if you: Have light-colored skin, light-colored eyes, or red or blond hair Spend a lot of time in the sun Tan regularly, either outdoors or in a tanning bed Have had blistering sunburns, especially during childhood Have a close family member who has had a melanoma Have atypical moles or large birthmarks  Early  detection of melanoma is key since treatment is typically straightforward and cure rates are extremely high if we catch it early.   The first sign of melanoma is often a change in a mole or a new dark spot.  The ABCDE system is a way of remembering the signs of melanoma.  A for asymmetry:  The two halves do not match. B for border:  The edges of the growth are irregular. C for color:  A mixture of colors are present instead of an even brown color. D for diameter:  Melanomas are usually (but not always) greater than 6mm - the size of a pencil eraser. E for evolution:  The spot keeps changing in size, shape, and color.  Please check your skin once per month between visits. You can use a small mirror in front and a large mirror behind you to keep an eye on the back side or your body.   If you see any new or changing lesions before your next follow-up, please call to schedule a visit.  Please continue daily skin protection including broad spectrum sunscreen SPF 30+ to sun-exposed areas, reapplying every 2 hours as needed when you're outdoors.   Staying in the shade or wearing long sleeves, sun glasses (UVA+UVB protection) and wide brim hats (4-inch brim around the entire circumference of the hat) are also recommended for sun protection.     Due to recent changes in healthcare laws, you may see results of   your pathology and/or laboratory studies on MyChart before the doctors have had a chance to review them. We understand that in some cases there may be results that are confusing or concerning to you. Please understand that not all results are received at the same time and often the doctors may need to interpret multiple results in order to provide you with the best plan of care or course of treatment. Therefore, we ask that you please give us 2 business days to thoroughly review all your results before contacting the office for clarification. Should we see a critical lab result, you will be contacted  sooner.   If You Need Anything After Your Visit  If you have any questions or concerns for your doctor, please call our main line at [phone] and press option 4 to reach your doctor's medical assistant. If no one answers, please leave a voicemail as directed and we will return your call as soon as possible. Messages left after 4 pm will be answered the following business day.   You may also send us a message via MyChart. We typically respond to MyChart messages within 1-2 business days.  For prescription refills, please ask your pharmacy to contact our office. Our fax number is [phone].  If you have an urgent issue when the clinic is closed that cannot wait until the next business day, you can page your doctor at the number below.    Please note that while we do our best to be available for urgent issues outside of office hours, we are not available 24/7.   If you have an urgent issue and are unable to reach us, you may choose to seek medical care at your doctor's office, retail clinic, urgent care center, or emergency room.  If you have a medical emergency, please immediately call 911 or go to the emergency department.  Pager Numbers  - Dr. Kowalski: [phone]  - Dr. Moye: [phone]  - Dr. Stewart: [phone]  In the event of inclement weather, please call our main line at [phone] for an update on the status of any delays or closures.  Dermatology Medication Tips: Please keep the boxes that topical medications come in in order to help keep track of the instructions about where and how to use these. Pharmacies typically print the medication instructions only on the boxes and not directly on the medication tubes.   If your medication is too expensive, please contact our office at [phone] option 4 or send us a message through MyChart.   We are unable to tell what your co-pay for medications will be in advance as this is different depending on your insurance  coverage. However, we may be able to find a substitute medication at lower cost or fill out paperwork to get insurance to cover a needed medication.   If a prior authorization is required to get your medication covered by your insurance company, please allow us 1-2 business days to complete this process.  Drug prices often vary depending on where the prescription is filled and some pharmacies may offer cheaper prices.  The website www.goodrx.com contains coupons for medications through different pharmacies. The prices here do not account for what the cost may be with help from insurance (it may be cheaper with your insurance), but the website can give you the price if you did not use any insurance.  - You can print the associated coupon and take it with your prescription to the pharmacy.  - You may also stop   by our office during regular business hours and pick up a GoodRx coupon card.  - If you need your prescription sent electronically to a different pharmacy, notify our office through [facility] MyChart or by phone at [phone] option 4.     Si Usted Necesita Algo Despus de Su Visita  Tambin puede enviarnos un mensaje a travs de MyChart. Por lo general respondemos a los mensajes de MyChart en el transcurso de 1 a 2 das hbiles.  Para renovar recetas, por favor pida a su farmacia que se ponga en contacto con nuestra oficina. Nuestro nmero de fax es el [phone].  Si tiene un asunto urgente cuando la clnica est cerrada y que no puede esperar hasta el siguiente da hbil, puede llamar/localizar a su doctor(a) al nmero que aparece a continuacin.   Por favor, tenga en cuenta que aunque hacemos todo lo posible para estar disponibles para asuntos urgentes fuera del horario de oficina, no estamos disponibles las 24 horas del da, los 7 das de la semana.   Si tiene un problema urgente y no puede comunicarse con nosotros, puede optar por buscar atencin mdica  en el consultorio de  su doctor(a), en una clnica privada, en un centro de atencin urgente o en una sala de emergencias.  Si tiene una emergencia mdica, por favor llame inmediatamente al 911 o vaya a la sala de emergencias.  Nmeros de bper  - Dr. Kowalski: [phone]  - Dra. Moye: [phone]  - Dra. Stewart: [phone]  En caso de inclemencias del tiempo, por favor llame a nuestra lnea principal al [phone] para una actualizacin sobre el estado de cualquier retraso o cierre.  Consejos para la medicacin en dermatologa: Por favor, guarde las cajas en las que vienen los medicamentos de uso tpico para ayudarle a seguir las instrucciones sobre dnde y cmo usarlos. Las farmacias generalmente imprimen las instrucciones del medicamento slo en las cajas y no directamente en los tubos del medicamento.   Si su medicamento es muy caro, por favor, pngase en contacto con nuestra oficina llamando al [phone] y presione la opcin 4 o envenos un mensaje a travs de MyChart.   No podemos decirle cul ser su copago por los medicamentos por adelantado ya que esto es diferente dependiendo de la cobertura de su seguro. Sin embargo, es posible que podamos encontrar un medicamento sustituto a menor costo o llenar un formulario para que el seguro cubra el medicamento que se considera necesario.   Si se requiere una autorizacin previa para que su compaa de seguros cubra su medicamento, por favor permtanos de 1 a 2 das hbiles para completar este proceso.  Los precios de los medicamentos varan con frecuencia dependiendo del lugar de dnde se surte la receta y alguna farmacias pueden ofrecer precios ms baratos.  El sitio web www.goodrx.com tiene cupones para medicamentos de diferentes farmacias. Los precios aqu no tienen en cuenta lo que podra costar con la ayuda del seguro (puede ser ms barato con su seguro), pero el sitio web puede darle el precio si no utiliz ningn seguro.  - Puede imprimir el  cupn correspondiente y llevarlo con su receta a la farmacia.  - Tambin puede pasar por nuestra oficina durante el horario de atencin regular y recoger una tarjeta de cupones de GoodRx.  - Si necesita que su receta se enve electrnicamente a una farmacia diferente, informe a nuestra oficina a travs de MyChart de [facility] o por telfono llamando al [phone] y presione la opcin 4.  

## 2022-04-01 ENCOUNTER — Encounter: Payer: Self-pay | Admitting: Dermatology

## 2022-04-03 DIAGNOSIS — F900 Attention-deficit hyperactivity disorder, predominantly inattentive type: Secondary | ICD-10-CM | POA: Diagnosis not present

## 2022-04-03 DIAGNOSIS — F331 Major depressive disorder, recurrent, moderate: Secondary | ICD-10-CM | POA: Diagnosis not present

## 2022-05-18 ENCOUNTER — Ambulatory Visit (INDEPENDENT_AMBULATORY_CARE_PROVIDER_SITE_OTHER): Payer: BC Managed Care – PPO | Admitting: Nurse Practitioner

## 2022-05-18 ENCOUNTER — Encounter: Payer: Self-pay | Admitting: Nurse Practitioner

## 2022-05-18 VITALS — BP 117/87 | HR 100 | Ht 66.14 in | Wt 227.1 lb

## 2022-05-18 DIAGNOSIS — Z23 Encounter for immunization: Secondary | ICD-10-CM | POA: Diagnosis not present

## 2022-05-18 DIAGNOSIS — R7303 Prediabetes: Secondary | ICD-10-CM

## 2022-05-18 DIAGNOSIS — G43909 Migraine, unspecified, not intractable, without status migrainosus: Secondary | ICD-10-CM

## 2022-05-18 DIAGNOSIS — F411 Generalized anxiety disorder: Secondary | ICD-10-CM

## 2022-05-18 LAB — POCT GLYCOSYLATED HEMOGLOBIN (HGB A1C): HbA1c POC (<> result, manual entry): 5.7 % (ref 4.0–5.6)

## 2022-05-18 MED ORDER — KETOROLAC TROMETHAMINE 10 MG PO TABS: 10.0000 mg | ORAL_TABLET | Freq: Three times a day (TID) | ORAL | 3 refills | Status: AC | PRN

## 2022-05-18 NOTE — Progress Notes (Signed)
Established patient visit   Patient: Becky Lutz   DOB: 11/10/1965   57 y.o. Female  MRN: [id number] Visit Date: 05/18/2022   Chief Complaint  Patient presents with   Medical Management of Chronic Issues   Subjective    HPI  Follow up -prediabetes  --HgbA1c check todaay is - 5.7 -work related stress/anxiety --this has mostly resolved. Has new position making more money within the same company.  --sees psychiatry for GAD/depression -does have history of migraine headaches.  --takes toradol as needed for headaches. This is currently the only medication that works for her that does not give negative side effects.    Medications: Outpatient Medications Prior to Visit  Medication Sig   Aspirin-Acetaminophen-Caffeine (EXCEDRIN MIGRAINE PO) Take 1-2 tablets by mouth 2 (two) times daily as needed (migraine).   dextroamphetamine (DEXTROSTAT) 10 MG tablet Take 10 mg by mouth daily.   DULoxetine (CYMBALTA) 60 MG capsule Take 60 mg by mouth daily. Along with 30 mg   [DISCONTINUED] ketorolac (TORADOL) 10 MG tablet Take 1 tablet (10 mg total) by mouth every 8 (eight) hours as needed (migraines).   No facility-administered medications prior to visit.    Review of Systems  Constitutional:  Negative for activity change, appetite change, chills, fatigue and fever.  HENT:  Negative for congestion, postnasal drip, rhinorrhea, sinus pressure, sinus pain, sneezing and sore throat.   Eyes: Negative.   Respiratory:  Negative for cough, chest tightness, shortness of breath and wheezing.   Cardiovascular:  Negative for chest pain and palpitations.  Gastrointestinal:  Negative for abdominal pain, constipation, diarrhea, nausea and vomiting.  Endocrine: Negative for cold intolerance, heat intolerance, polydipsia and polyuria.       Blood sugars doing well    Genitourinary:  Negative for dyspareunia, dysuria, flank pain, frequency and urgency.  Musculoskeletal:  Negative for arthralgias, back  pain and myalgias.  Skin:  Negative for rash.  Allergic/Immunologic: Negative for environmental allergies.  Neurological:  Positive for headaches. Negative for dizziness and weakness.  Hematological:  Negative for adenopathy.  Psychiatric/Behavioral:  The patient is nervous/anxious.     Last CBC Lab Results  Component Value Date   WBC 4.7 08/29/2021   HGB 14.3 08/29/2021   HCT 43.6 08/29/2021   MCV 93 08/29/2021   MCH 30.4 08/29/2021   RDW 12.2 08/29/2021   PLT 208 [id number]   Last metabolic panel Lab Results  Component Value Date   GLUCOSE 96 08/29/2021   NA 144 08/29/2021   K 4.3 08/29/2021   CL 106 08/29/2021   CO2 21 08/29/2021   BUN 19 08/29/2021   CREATININE 0.94 08/29/2021   EGFR 70 08/29/2021   CALCIUM 9.4 08/29/2021   PROT 6.7 08/29/2021   ALBUMIN 4.3 08/29/2021   LABGLOB 2.4 08/29/2021   AGRATIO 1.8 08/29/2021   BILITOT 0.3 08/29/2021   ALKPHOS 70 08/29/2021   AST 14 08/29/2021   ALT 17 08/29/2021   ANIONGAP 5 07/17/2021   Last lipids Lab Results  Component Value Date   CHOL 189 08/29/2021   HDL 68 08/29/2021   LDLCALC 104 (H) 08/29/2021   TRIG 96 08/29/2021   CHOLHDL 2.8 08/29/2021   Last hemoglobin A1c Lab Results  Component Value Date   HGBA1C 5.7 05/18/2022   Last thyroid functions Lab Results  Component Value Date   TSH 2.910 08/29/2021        Objective     Today's Vitals   05/18/22 1314  BP: 117/87  Pulse: 100  SpO2:  96%  Weight: 227 lb 1.9 oz (103 kg)  Height: 5' 6.14" (1.68 m)   Body mass index is 36.5 kg/m.  BP Readings from Last 3 Encounters:  05/18/22 117/87  03/23/22 111/70  01/15/22 119/82    Wt Readings from Last 3 Encounters:  05/18/22 227 lb 1.9 oz (103 kg)  01/15/22 228 lb 6.4 oz (103.6 kg)  09/03/21 227 lb (103 kg)    Physical Exam Vitals and nursing note reviewed.  Constitutional:      Appearance: Normal appearance. She is well-developed.  HENT:     Head: Normocephalic and atraumatic.     Nose:  Nose normal.     Mouth/Throat:     Mouth: Mucous membranes are moist.     Pharynx: Oropharynx is clear.  Eyes:     Extraocular Movements: Extraocular movements intact.     Conjunctiva/sclera: Conjunctivae normal.     Pupils: Pupils are equal, round, and reactive to light.  Cardiovascular:     Rate and Rhythm: Normal rate and regular rhythm.     Pulses: Normal pulses.     Heart sounds: Normal heart sounds.  Pulmonary:     Effort: Pulmonary effort is normal.     Breath sounds: Normal breath sounds.  Abdominal:     Palpations: Abdomen is soft.  Musculoskeletal:        General: Normal range of motion.     Cervical back: Normal range of motion and neck supple.  Lymphadenopathy:     Cervical: No cervical adenopathy.  Skin:    General: Skin is warm and dry.     Capillary Refill: Capillary refill takes less than 2 seconds.  Neurological:     General: No focal deficit present.     Mental Status: She is alert and oriented to person, place, and time.  Psychiatric:        Mood and Affect: Mood normal.        Behavior: Behavior normal.        Thought Content: Thought content normal.        Judgment: Judgment normal.      Results for orders placed or performed in visit on 05/18/22  POCT glycosylated hemoglobin (Hb A1C)  Result Value Ref Range   Hemoglobin A1C     HbA1c POC (<> result, manual entry) 5.7 4.0 - 5.6 %   HbA1c, POC (prediabetic range)     HbA1c, POC (controlled diabetic range)      Assessment & Plan    1. Prediabetes Hgba1c 5.7 today. Continue to control with diet. Recheck in 4 months.  - POCT glycosylated hemoglobin (Hb A1C)  2. Acute migraine May take toradol 10 mg as needed for acute migraines. New prescription sent to her pharmacy.  - ketorolac (TORADOL) 10 MG tablet; Take 1 tablet (10 mg total) by mouth every 8 (eight) hours as needed (migraines).  Dispense: 20 tablet; Refill: 3  3. Generalized anxiety disorder Continue regular visits with psychiatry.  4.  Need for shingles vaccine  -first shingles vaccine administered during today's visit. Second vaccine to be administered in 4 months.     Problem List Items Addressed This Visit       Cardiovascular and Mediastinum   Acute migraine   Relevant Medications   ketorolac (TORADOL) 10 MG tablet     Other   Prediabetes - Primary   Relevant Orders   POCT glycosylated hemoglobin (Hb A1C) (Completed)   Generalized anxiety disorder     Return in about 4  months (around 09/17/2022) for prediabetes , check HgbA1c. needs 2nd shingles vaccine .         Ronnell Freshwater, NP  [facility] [phone] (phone) [phone] (fax)  [place]

## 2022-05-18 NOTE — Addendum Note (Signed)
Addended by: Vivia Birmingham on: 05/18/2022 02:36 PM   Modules accepted: Orders

## 2022-06-24 ENCOUNTER — Ambulatory Visit (INDEPENDENT_AMBULATORY_CARE_PROVIDER_SITE_OTHER): Payer: BC Managed Care – PPO | Admitting: Dermatology

## 2022-06-24 VITALS — BP 121/72 | HR 89

## 2022-06-24 DIAGNOSIS — L820 Inflamed seborrheic keratosis: Secondary | ICD-10-CM

## 2022-06-24 NOTE — Progress Notes (Signed)
   Follow-Up Visit   Subjective  Becky Lutz is a 57 y.o. female who presents for the following: 3 months f/u on ISK on the right elbow treated with LN2 with a fair response.    The following portions of the chart were reviewed this encounter and updated as appropriate: medications, allergies, medical history  Review of Systems:  No other skin or systemic complaints except as noted in HPI or Assessment and Plan.  Objective  Well appearing patient in no apparent distress; mood and affect are within normal limits.  A focused examination was performed of the following areas: right elbow    Relevant exam findings are noted in the Assessment and Plan.  right elbow Stuck-on, waxy, tan-brown papule--Discussed benign etiology and prognosis.     Assessment & Plan     Inflamed seborrheic keratosis right elbow  Symptomatic, irritating, patient would like treated.   Destruction of lesion - right elbow Complexity: simple   Destruction method: cryotherapy   Informed consent: discussed and consent obtained   Timeout:  patient name, date of birth, surgical site, and procedure verified Lesion destroyed using liquid nitrogen: Yes   Region frozen until ice ball extended beyond lesion: Yes   Outcome: patient tolerated procedure well with no complications   Post-procedure details: wound care instructions given     Return if symptoms worsen or fail to improve.  IAngelique Holm, CMA, am acting as scribe for Armida Sans, MD .   Documentation: I have reviewed the above documentation for accuracy and completeness, and I agree with the above.  Armida Sans, MD

## 2022-06-24 NOTE — Patient Instructions (Addendum)
Cryotherapy Aftercare  Wash gently with soap and water everyday.   Apply Vaseline and Band-Aid daily until healed.     Due to recent changes in healthcare laws, you may see results of your pathology and/or laboratory studies on MyChart before the doctors have had a chance to review them. We understand that in some cases there may be results that are confusing or concerning to you. Please understand that not all results are received at the same time and often the doctors may need to interpret multiple results in order to provide you with the best plan of care or course of treatment. Therefore, we ask that you please give us 2 business days to thoroughly review all your results before contacting the office for clarification. Should we see a critical lab result, you will be contacted sooner.   If You Need Anything After Your Visit  If you have any questions or concerns for your doctor, please call our main line at [phone] and press option 4 to reach your doctor's medical assistant. If no one answers, please leave a voicemail as directed and we will return your call as soon as possible. Messages left after 4 pm will be answered the following business day.   You may also send us a message via MyChart. We typically respond to MyChart messages within 1-2 business days.  For prescription refills, please ask your pharmacy to contact our office. Our fax number is [phone].  If you have an urgent issue when the clinic is closed that cannot wait until the next business day, you can page your doctor at the number below.    Please note that while we do our best to be available for urgent issues outside of office hours, we are not available 24/7.   If you have an urgent issue and are unable to reach us, you may choose to seek medical care at your doctor's office, retail clinic, urgent care center, or emergency room.  If you have a medical emergency, please immediately call 911 or go to the  emergency department.  Pager Numbers  - Dr. Kowalski: [phone]  - Dr. Moye: [phone]  - Dr. Stewart: [phone]  In the event of inclement weather, please call our main line at [phone] for an update on the status of any delays or closures.  Dermatology Medication Tips: Please keep the boxes that topical medications come in in order to help keep track of the instructions about where and how to use these. Pharmacies typically print the medication instructions only on the boxes and not directly on the medication tubes.   If your medication is too expensive, please contact our office at [phone] option 4 or send us a message through MyChart.   We are unable to tell what your co-pay for medications will be in advance as this is different depending on your insurance coverage. However, we may be able to find a substitute medication at lower cost or fill out paperwork to get insurance to cover a needed medication.   If a prior authorization is required to get your medication covered by your insurance company, please allow us 1-2 business days to complete this process.  Drug prices often vary depending on where the prescription is filled and some pharmacies may offer cheaper prices.  The website www.goodrx.com contains coupons for medications through different pharmacies. The prices here do not account for what the cost may be with help from insurance (it may be cheaper with your insurance), but the website can   give you the price if you did not use any insurance.  - You can print the associated coupon and take it with your prescription to the pharmacy.  - You may also stop by our office during regular business hours and pick up a GoodRx coupon card.  - If you need your prescription sent electronically to a different pharmacy, notify our office through  MyChart or by phone at [phone] option 4.     Si Usted Necesita Algo Despus de Su Visita  Tambin puede  enviarnos un mensaje a travs de MyChart. Por lo general respondemos a los mensajes de MyChart en el transcurso de 1 a 2 das hbiles.  Para renovar recetas, por favor pida a su farmacia que se ponga en contacto con nuestra oficina. Nuestro nmero de fax es el [phone].  Si tiene un asunto urgente cuando la clnica est cerrada y que no puede esperar hasta el siguiente da hbil, puede llamar/localizar a su doctor(a) al nmero que aparece a continuacin.   Por favor, tenga en cuenta que aunque hacemos todo lo posible para estar disponibles para asuntos urgentes fuera del horario de oficina, no estamos disponibles las 24 horas del da, los 7 das de la semana.   Si tiene un problema urgente y no puede comunicarse con nosotros, puede optar por buscar atencin mdica  en el consultorio de su doctor(a), en una clnica privada, en un centro de atencin urgente o en una sala de emergencias.  Si tiene una emergencia mdica, por favor llame inmediatamente al 911 o vaya a la sala de emergencias.  Nmeros de bper  - Dr. Kowalski: [phone]  - Dra. Moye: [phone]  - Dra. Stewart: [phone]  En caso de inclemencias del tiempo, por favor llame a nuestra lnea principal al [phone] para una actualizacin sobre el estado de cualquier retraso o cierre.  Consejos para la medicacin en dermatologa: Por favor, guarde las cajas en las que vienen los medicamentos de uso tpico para ayudarle a seguir las instrucciones sobre dnde y cmo usarlos. Las farmacias generalmente imprimen las instrucciones del medicamento slo en las cajas y no directamente en los tubos del medicamento.   Si su medicamento es muy caro, por favor, pngase en contacto con nuestra oficina llamando al [phone] y presione la opcin 4 o envenos un mensaje a travs de MyChart.   No podemos decirle cul ser su copago por los medicamentos por adelantado ya que esto es diferente dependiendo de la cobertura de su seguro.  Sin embargo, es posible que podamos encontrar un medicamento sustituto a menor costo o llenar un formulario para que el seguro cubra el medicamento que se considera necesario.   Si se requiere una autorizacin previa para que su compaa de seguros cubra su medicamento, por favor permtanos de 1 a 2 das hbiles para completar este proceso.  Los precios de los medicamentos varan con frecuencia dependiendo del lugar de dnde se surte la receta y alguna farmacias pueden ofrecer precios ms baratos.  El sitio web www.goodrx.com tiene cupones para medicamentos de diferentes farmacias. Los precios aqu no tienen en cuenta lo que podra costar con la ayuda del seguro (puede ser ms barato con su seguro), pero el sitio web puede darle el precio si no utiliz ningn seguro.  - Puede imprimir el cupn correspondiente y llevarlo con su receta a la farmacia.  - Tambin puede pasar por nuestra oficina durante el horario de atencin regular y recoger una tarjeta de cupones de GoodRx.  -   Si necesita que su receta se enve electrnicamente a una farmacia diferente, informe a nuestra oficina a travs de MyChart de [place] o por telfono llamando al [phone] y presione la opcin 4.  

## 2022-06-30 ENCOUNTER — Encounter: Payer: Self-pay | Admitting: Dermatology

## 2022-08-10 DIAGNOSIS — F3342 Major depressive disorder, recurrent, in full remission: Secondary | ICD-10-CM | POA: Diagnosis not present

## 2022-08-10 DIAGNOSIS — G4700 Insomnia, unspecified: Secondary | ICD-10-CM | POA: Diagnosis not present

## 2022-08-10 DIAGNOSIS — Z79899 Other long term (current) drug therapy: Secondary | ICD-10-CM | POA: Diagnosis not present

## 2022-09-02 ENCOUNTER — Telehealth: Payer: Self-pay | Admitting: *Deleted

## 2022-09-02 NOTE — Telephone Encounter (Signed)
LVM to get appt rescheduled that was cancelled due to provider no longer being at office

## 2022-09-17 ENCOUNTER — Ambulatory Visit: Payer: BC Managed Care – PPO | Admitting: Nurse Practitioner

## 2022-12-07 DIAGNOSIS — F900 Attention-deficit hyperactivity disorder, predominantly inattentive type: Secondary | ICD-10-CM | POA: Diagnosis not present

## 2022-12-07 DIAGNOSIS — F331 Major depressive disorder, recurrent, moderate: Secondary | ICD-10-CM | POA: Diagnosis not present

## 2022-12-07 DIAGNOSIS — F3342 Major depressive disorder, recurrent, in full remission: Secondary | ICD-10-CM | POA: Diagnosis not present

## 2023-07-29 IMAGING — DX DG HAND COMPLETE 3+V*L*
3 series · 3 of 3 positions shown · non-contrast
Comparison: None Available.

CLINICAL DATA: Status post dog bite.

EXAM:
LEFT HAND - COMPLETE 3+ VIEW

[hand ap]
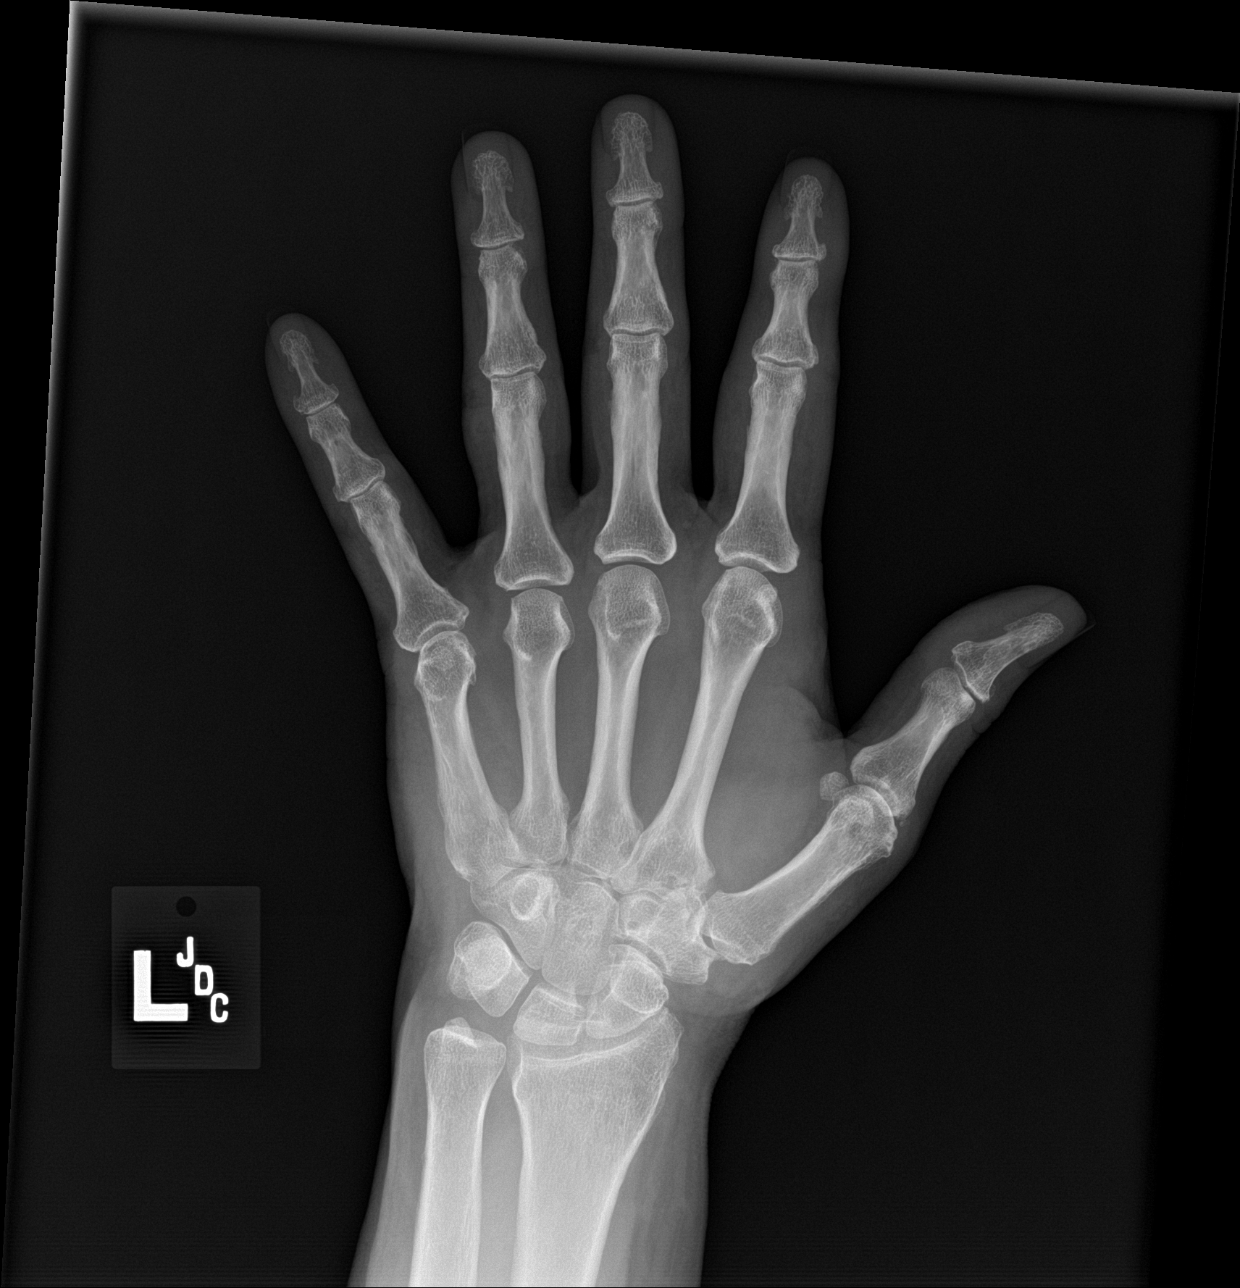

[hand obl]
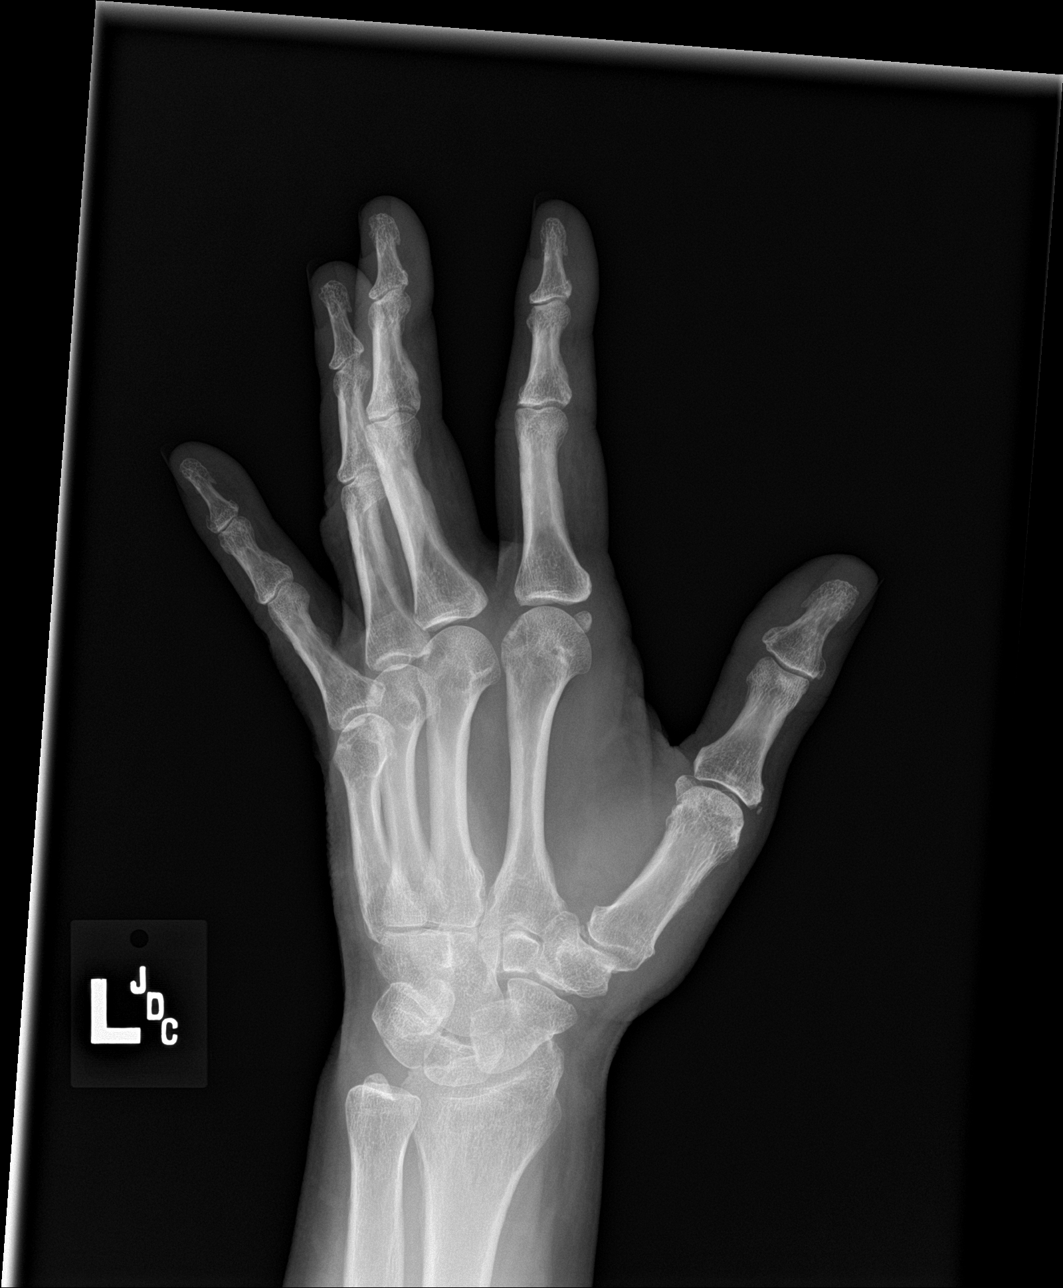

[hand lat]
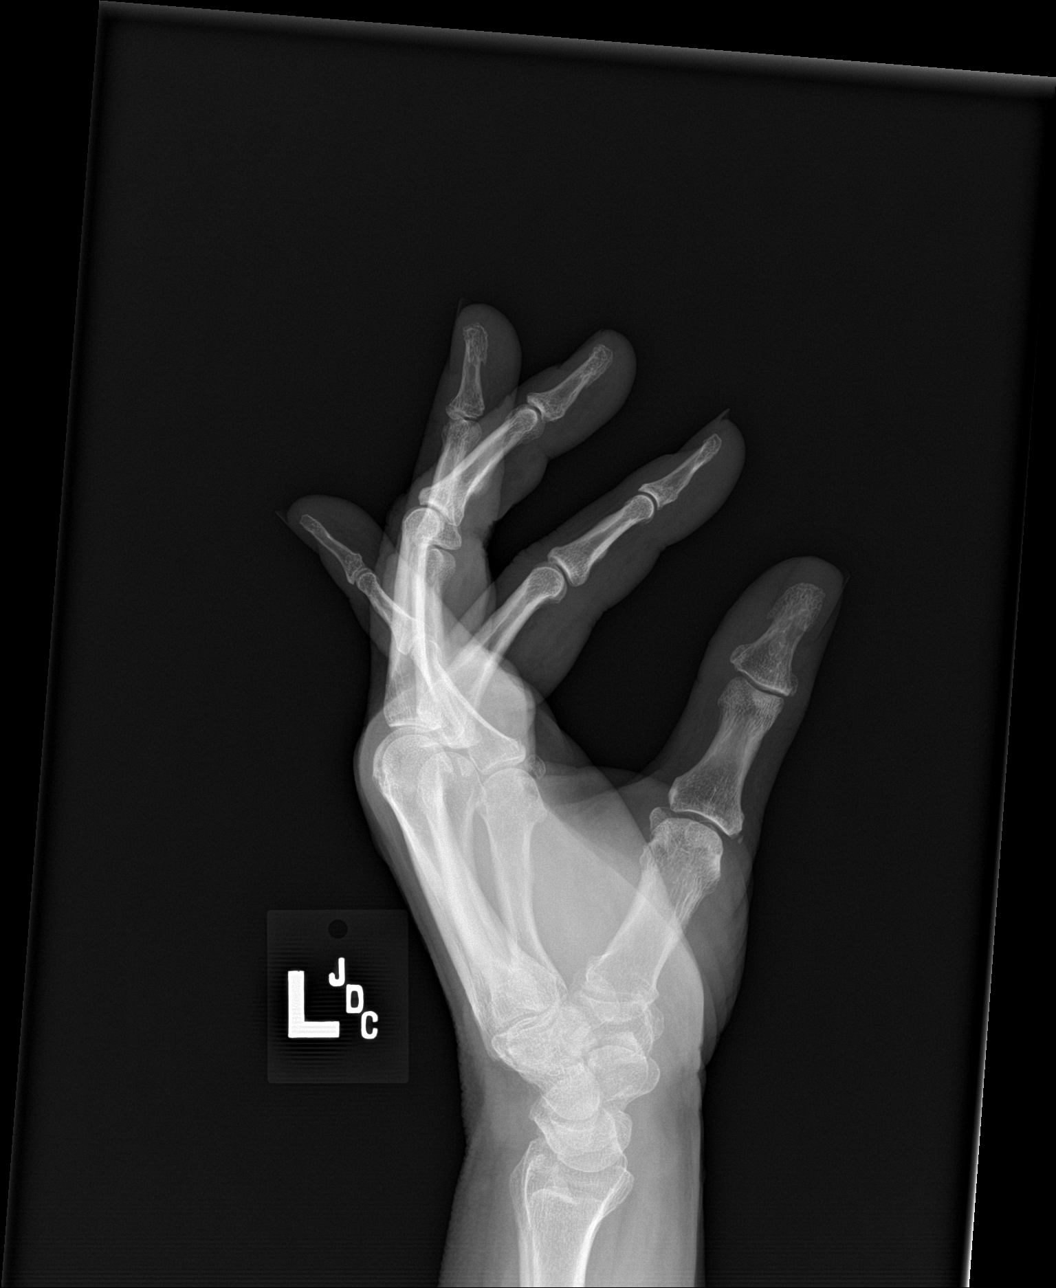

[3 of 3 positions shown; findings below may reference images not displayed]

FINDINGS: There is no evidence of fracture or dislocation. There is no
evidence of arthropathy or other focal bone abnormality. Soft
tissues are unremarkable.
IMPRESSION: Negative.
